# Patient Record
Sex: Male | Born: 1955 | Race: White | Hispanic: No | Marital: Married | Smoking: Former smoker
Health system: Southern US, Community
[De-identification: ages and names within clinical notes are randomized; demographics above are authoritative.]

## PROBLEM LIST (undated history)

## (undated) DIAGNOSIS — F1010 Alcohol abuse, uncomplicated: Secondary | ICD-10-CM

## (undated) DIAGNOSIS — I10 Essential (primary) hypertension: Secondary | ICD-10-CM

## (undated) DIAGNOSIS — E7800 Pure hypercholesterolemia, unspecified: Secondary | ICD-10-CM

## (undated) DIAGNOSIS — Z8739 Personal history of other diseases of the musculoskeletal system and connective tissue: Secondary | ICD-10-CM

## (undated) DIAGNOSIS — K279 Peptic ulcer, site unspecified, unspecified as acute or chronic, without hemorrhage or perforation: Secondary | ICD-10-CM

## (undated) DIAGNOSIS — I2510 Atherosclerotic heart disease of native coronary artery without angina pectoris: Secondary | ICD-10-CM

## (undated) DIAGNOSIS — K219 Gastro-esophageal reflux disease without esophagitis: Secondary | ICD-10-CM

## (undated) HISTORY — DX: Alcohol abuse, uncomplicated: F10.10

## (undated) HISTORY — DX: Gastro-esophageal reflux disease without esophagitis: K21.9

## (undated) HISTORY — DX: Atherosclerotic heart disease of native coronary artery without angina pectoris: I25.10

## (undated) HISTORY — DX: Peptic ulcer, site unspecified, unspecified as acute or chronic, without hemorrhage or perforation: K27.9

## (undated) HISTORY — PX: APPENDECTOMY: SHX54

## (undated) HISTORY — PX: NOSE SURGERY: SHX723

## (undated) HISTORY — DX: Pure hypercholesterolemia, unspecified: E78.00

## (undated) HISTORY — PX: HERNIA REPAIR: SHX51

## (undated) HISTORY — DX: Personal history of other diseases of the musculoskeletal system and connective tissue: Z87.39

## (undated) HISTORY — DX: Essential (primary) hypertension: I10

## (undated) HISTORY — PX: ACHILLES TENDON REPAIR: SUR1153

---

## 2010-10-01 DIAGNOSIS — G250 Essential tremor: Secondary | ICD-10-CM | POA: Insufficient documentation

## 2012-05-24 DIAGNOSIS — M5416 Radiculopathy, lumbar region: Secondary | ICD-10-CM | POA: Insufficient documentation

## 2012-05-24 DIAGNOSIS — M47816 Spondylosis without myelopathy or radiculopathy, lumbar region: Secondary | ICD-10-CM | POA: Insufficient documentation

## 2013-06-08 DIAGNOSIS — K5730 Diverticulosis of large intestine without perforation or abscess without bleeding: Secondary | ICD-10-CM | POA: Insufficient documentation

## 2013-12-07 DIAGNOSIS — M5416 Radiculopathy, lumbar region: Secondary | ICD-10-CM | POA: Insufficient documentation

## 2017-11-24 DIAGNOSIS — Z79899 Other long term (current) drug therapy: Secondary | ICD-10-CM | POA: Insufficient documentation

## 2018-01-19 ENCOUNTER — Ambulatory Visit: Payer: 59 | Admitting: Osteopathic Medicine

## 2018-01-19 ENCOUNTER — Telehealth: Payer: Self-pay | Admitting: Osteopathic Medicine

## 2018-01-19 ENCOUNTER — Encounter: Payer: Self-pay | Admitting: Osteopathic Medicine

## 2018-01-19 VITALS — BP 137/84 | HR 80 | Temp 97.9°F | Ht 72.0 in | Wt 210.6 lb

## 2018-01-19 DIAGNOSIS — K449 Diaphragmatic hernia without obstruction or gangrene: Secondary | ICD-10-CM | POA: Insufficient documentation

## 2018-01-19 DIAGNOSIS — K219 Gastro-esophageal reflux disease without esophagitis: Secondary | ICD-10-CM | POA: Insufficient documentation

## 2018-01-19 DIAGNOSIS — M5030 Other cervical disc degeneration, unspecified cervical region: Secondary | ICD-10-CM | POA: Insufficient documentation

## 2018-01-19 DIAGNOSIS — I10 Essential (primary) hypertension: Secondary | ICD-10-CM | POA: Diagnosis not present

## 2018-01-19 DIAGNOSIS — G5712 Meralgia paresthetica, left lower limb: Secondary | ICD-10-CM | POA: Insufficient documentation

## 2018-01-19 DIAGNOSIS — M545 Low back pain, unspecified: Secondary | ICD-10-CM | POA: Insufficient documentation

## 2018-01-19 DIAGNOSIS — E782 Mixed hyperlipidemia: Secondary | ICD-10-CM | POA: Insufficient documentation

## 2018-01-19 DIAGNOSIS — M5416 Radiculopathy, lumbar region: Secondary | ICD-10-CM

## 2018-01-19 DIAGNOSIS — M5412 Radiculopathy, cervical region: Secondary | ICD-10-CM | POA: Insufficient documentation

## 2018-01-19 DIAGNOSIS — F419 Anxiety disorder, unspecified: Secondary | ICD-10-CM | POA: Insufficient documentation

## 2018-01-19 DIAGNOSIS — G479 Sleep disorder, unspecified: Secondary | ICD-10-CM | POA: Insufficient documentation

## 2018-01-19 MED ORDER — OMEPRAZOLE MAGNESIUM 20 MG PO TBEC: 20.0000 mg | DELAYED_RELEASE_TABLET | Freq: Every day | ORAL | 3 refills | Status: AC

## 2018-01-19 MED ORDER — GABAPENTIN 600 MG PO TABS: ORAL_TABLET | ORAL | 3 refills | Status: DC

## 2018-01-19 MED ORDER — FLUVASTATIN SODIUM ER 80 MG PO TB24: 80.0000 mg | ORAL_TABLET | Freq: Every day | ORAL | 1 refills | Status: DC

## 2018-01-19 MED ORDER — LOSARTAN POTASSIUM 100 MG PO TABS: 100.0000 mg | ORAL_TABLET | Freq: Every day | ORAL | 3 refills | Status: DC

## 2018-01-19 NOTE — Progress Notes (Signed)
HPI: Steven Hebert is a 62 y.o. male who  has no past medical history on file.  [facility] today, 01/19/18,  for chief complaint of: New to establish care Discuss medications   Pleasant new patient here to establish care. Works as Geophysical data processorconstruction manager. Married.   Working on quitting alcohol, seeing a psychiatrist. Doing well on Acomprosate to promote EtOH abstinence, he is cutting back week by week with goal of being off alcohol and Xanax by early next year. Has started Melatonin.   GERD and Hx hiatal hernia and NSAID-induced stomach ulcer. Symptoms controlled on PPI.   Cronic L leg pain, following with orthopedics for this and lower back pain and neck pain. Has had several epidural injections which provided relief. Takes gabapentin qhs and rare use hydrocodone-APAP.   HTN controlled on Losartan.   HLD would like to get back on fluvastatin.    Available records reviewed:   Patient had annual physical 11/24/2017 w/ Dr Kathryne GinGavour [facility]  No medication changes required at that time.  Patient also being monitored for essential hypertension which is under control, chronic pain issues including meralgia paresthetica of left side, left lumbar radiculopathy.  Chronic benzodiazepine use was also addressed.  Flu vaccine was received.  Alcohol use was discussed referral was made to pain management.  Prior to that, had seen a few other doctors at that practice over the years.   PDMP reviewed:   Chronic use alprazolam 0.5 mg daily (#30 x30 days, filled monthly), Rx Dr Ulis Riasavid Cloward   Occasional Rx for Hydrocodone    Past medical, surgical, social and family history reviewed:  Patient Active Problem List   Diagnosis Date Noted  . Anxiety 01/19/2018  . Cervical radiculitis 01/19/2018  . DDD (degenerative disc disease), cervical 01/19/2018  . Esophageal reflux 01/19/2018  . Essential hypertension 01/19/2018  . Hiatal hernia 01/19/2018  .  Low back pain 01/19/2018  . Meralgia paresthetica of left side 01/19/2018  . Mixed hyperlipidemia 01/19/2018  . Sleep disorder 01/19/2018  . Chronic prescription benzodiazepine use 11/24/2017  . Diverticulosis of colon 06/08/2013  . Left lumbar radiculopathy 05/24/2012  . Essential tremor 10/01/2010    Past Surgical History:  Procedure Laterality Date  . ACHILLES TENDON REPAIR    . APPENDECTOMY    . HERNIA REPAIR    . NOSE SURGERY      Social History   Tobacco Use  . Smoking status: Former Smoker    Packs/day: 0.50    Years: 10.00    Pack years: 5.00    Types: Cigarettes    Last attempt to quit: 1977    Years since quitting: 42.9  . Smokeless tobacco: Never Used  Substance Use Topics  . Alcohol use: Yes    Alcohol/week: 28.0 standard drinks    Types: 28 Cans of beer per week    No family history on file.   Current medication list and allergy/intolerance information reviewed:    Current Outpatient Medications  Medication Sig Dispense Refill  . ALPRAZolam (XANAX) 0.5 MG tablet Take by mouth.    . gabapentin (NEURONTIN) 600 MG tablet TAKE 1 TABLET BY MOUTH EVERY DAY AT NIGHT 90 tablet 3  . HYDROcodone-acetaminophen (NORCO/VICODIN) 5-325 MG tablet Take by mouth.    . losartan (COZAAR) 100 MG tablet Take 1 tablet (100 mg total) by mouth daily. 90 tablet 3  . omeprazole (PRILOSEC OTC) 20 MG tablet Take 1 tablet (20 mg total) by mouth daily. 90  tablet 3  . fluvastatin XL (LESCOL XL) 80 MG 24 hr tablet Take 1 tablet (80 mg total) by mouth daily. 90 tablet 1   No current facility-administered medications for this visit.     Allergies  Allergen Reactions  . Nsaids Other (See Comments)  . Penicillins Rash      Review of Systems:  Constitutional:  No  fever, no chills, No recent illness, No unintentional weight changes. No significant fatigue.   HEENT: No  headache, no vision change, no hearing change, No sore throat, No  sinus pressure  Cardiac: No  chest pain, No   pressure, No palpitations, No  Orthopnea  Respiratory:  No  shortness of breath. No  Cough  Gastrointestinal: No  abdominal pain, No  nausea, No  vomiting,  No  blood in stool, No  diarrhea, No  Constipation, +heartburn  Musculoskeletal: No new myalgia/arthralgia  Skin: No  Rash, No other wounds/concerning lesions  Genitourinary: No  incontinence, No  abnormal genital bleeding, No abnormal genital discharge  Hem/Onc: No  easy bruising/bleeding, No  abnormal lymph node  Endocrine: No cold intolerance,  No heat intolerance. No polyuria/polydipsia/polyphagia   Neurologic: No  weakness, No  dizziness, No  slurred speech/focal weakness/facial droop, +numness/tingling L leg   Psychiatric: No  concerns with depression, No  concerns with anxiety, +sleep problems, No mood problems  Exam:  BP 137/84 (BP Location: Left Arm, Patient Position: Sitting, Cuff Size: Normal)   Pulse 80   Temp 97.9 F (36.6 C) (Oral)   Ht 6' (1.829 m)   Wt 210 lb 9.6 oz (95.5 kg)   BMI 28.56 kg/m   Constitutional: VS see above. General Appearance: alert, well-developed, well-nourished, NAD  Eyes: Normal lids and conjunctive, non-icteric sclera  Ears, Nose, Mouth, Throat: MMM, Normal external inspection ears/nares/mouth/lips/gums. TM normal bilaterally. Pharynx/tonsils no erythema, no exudate. Nasal mucosa normal.   Neck: No masses, trachea midline. No thyroid enlargement. No tenderness/mass appreciated. No lymphadenopathy  Respiratory: Normal respiratory effort. no wheeze, no rhonchi, no rales  Cardiovascular: S1/S2 normal, no murmur, no rub/gallop auscultated. RRR. No lower extremity edema.   Gastrointestinal: Nontender, no masses. No hepatomegaly, no splenomegaly. No hernia appreciated. Bowel sounds normal. Rectal exam deferred.   Musculoskeletal: Gait normal. No clubbing/cyanosis of digits.   Neurological: Normal balance/coordination. No tremor. No cranial nerve deficit on limited exam. Motor and  sensation intact and symmetric. Cerebellar reflexes intact.   Skin: warm, dry, intact. No rash/ulcer. No concerning nevi or subq nodules on limited exam.    Psychiatric: Normal judgment/insight. Normal mood and affect. Oriented x3.      ASSESSMENT/PLAN: The primary encounter diagnosis was Hiatal hernia. Diagnoses of Gastroesophageal reflux disease, esophagitis presence not specified, Essential hypertension, Mixed hyperlipidemia, and Left lumbar radiculopathy were also pertinent to this visit.   Orders Placed This Encounter  Procedures  . CBC  . COMPLETE METABOLIC PANEL WITH GFR  . Lipid panel    Meds ordered this encounter  Medications  . fluvastatin XL (LESCOL XL) 80 MG 24 hr tablet    Sig: Take 1 tablet (80 mg total) by mouth daily.    Dispense:  90 tablet    Refill:  1  . losartan (COZAAR) 100 MG tablet    Sig: Take 1 tablet (100 mg total) by mouth daily.    Dispense:  90 tablet    Refill:  3  . omeprazole (PRILOSEC OTC) 20 MG tablet    Sig: Take 1 tablet (20 mg total) by mouth  daily.    Dispense:  90 tablet    Refill:  3  . gabapentin (NEURONTIN) 600 MG tablet    Sig: TAKE 1 TABLET BY MOUTH EVERY DAY AT NIGHT    Dispense:  90 tablet    Refill:  3    Patient Instructions  Will request records from:  [facility]  [facility]   When in need of refills, please have your pharmacy send requests to [place]. If any problems, please call our office!   Will plan to see each other again in 3 months to check in, and repeat blood work prior to that visit. Orders are in.   Happy Holidays!          Visit summary with medication list and pertinent instructions was printed for patient to review. All questions at time of visit were answered - patient instructed to contact office with any additional concerns or updates. ER/RTC precautions were reviewed with the patient.    Please note: voice recognition software was used to produce this  document, and typos may escape review. Please contact Dr. Lyn Hollingshead for any needed clarifications.     Follow-up plan: Return in about 3 months (around 04/20/2018) for recheck how you're doing with alcohol, alprazolam, etc! Sooner if needed .

## 2018-01-19 NOTE — Telephone Encounter (Signed)
Received fax from [employer] that [employer] requires a PA. Information has been sent to the insurance company. Awaiting determination.

## 2018-01-19 NOTE — Patient Instructions (Signed)
Will request records from:  [facility]  [facility]   [facility]   When in need of refills, please have your pharmacy send requests to us. If any problems, please call our office!   Will plan to see each other again in 3 months to check in, and repeat blood work prior to that visit. Orders are in.   Happy Holidays!

## 2018-01-20 ENCOUNTER — Encounter: Payer: Self-pay | Admitting: Osteopathic Medicine

## 2018-01-20 NOTE — Telephone Encounter (Signed)
Called [facility] and advised them of the approval from 01/19/2018 through 01/21/2019. Pharmacy aware and form sent to scan.

## 2018-04-19 LAB — COMPLETE METABOLIC PANEL WITH GFR
AG Ratio: 1.6 (calc) (ref 1.0–2.5)
ALT: 43 U/L (ref 9–46)
AST: 27 U/L (ref 10–35)
Albumin: 4.4 g/dL (ref 3.6–5.1)
Alkaline phosphatase (APISO): 62 U/L (ref 35–144)
BILIRUBIN TOTAL: 0.7 mg/dL (ref 0.2–1.2)
BUN: 16 mg/dL (ref 7–25)
CO2: 26 mmol/L (ref 20–32)
CREATININE: 0.89 mg/dL (ref 0.70–1.25)
Calcium: 9.7 mg/dL (ref 8.6–10.3)
Chloride: 102 mmol/L (ref 98–110)
GFR, EST AFRICAN AMERICAN: 105 mL/min/{1.73_m2} (ref >=60)
GFR, EST NON AFRICAN AMERICAN: 90 mL/min/{1.73_m2} (ref >=60)
Globulin: 2.8 g/dL (calc) (ref 1.9–3.7)
Glucose, Bld: 111 mg/dL — ABNORMAL HIGH (ref 65–99)
Potassium: 4.4 mmol/L (ref 3.5–5.3)
Sodium: 137 mmol/L (ref 135–146)
Total Protein: 7.2 g/dL (ref 6.1–8.1)

## 2018-04-19 LAB — CBC
HCT: 46.1 % (ref 38.5–50.0)
Hemoglobin: 16 g/dL (ref 13.2–17.1)
MCH: 32.2 pg (ref 27.0–33.0)
MCHC: 34.7 g/dL (ref 32.0–36.0)
MCV: 92.8 fL (ref 80.0–100.0)
MPV: 10.6 fL (ref 7.5–12.5)
Platelets: 193 10*3/uL (ref 140–400)
RBC: 4.97 10*6/uL (ref 4.20–5.80)
RDW: 13.2 % (ref 11.0–15.0)
WBC: 3.6 10*3/uL — ABNORMAL LOW (ref 3.8–10.8)

## 2018-04-19 LAB — LIPID PANEL
Cholesterol: 208 mg/dL — ABNORMAL HIGH (ref <200)
HDL: 50 mg/dL (ref >=40)
LDL Cholesterol (Calc): 130 mg/dL (calc) — ABNORMAL HIGH
Non-HDL Cholesterol (Calc): 158 mg/dL (calc) — ABNORMAL HIGH (ref <130)
Total CHOL/HDL Ratio: 4.2 (calc) (ref <5.0)
Triglycerides: 160 mg/dL — ABNORMAL HIGH (ref <150)

## 2018-04-19 LAB — TEST AUTHORIZATION

## 2018-04-19 LAB — HEMOGLOBIN A1C W/OUT EAG: Hgb A1c MFr Bld: 5.6 % of total Hgb (ref <5.7)

## 2018-04-20 ENCOUNTER — Other Ambulatory Visit: Payer: Self-pay

## 2018-04-20 ENCOUNTER — Ambulatory Visit: Payer: 59 | Admitting: Osteopathic Medicine

## 2018-04-20 ENCOUNTER — Encounter: Payer: Self-pay | Admitting: Osteopathic Medicine

## 2018-04-20 VITALS — BP 136/76 | HR 74 | Temp 97.7°F | Wt 215.7 lb

## 2018-04-20 DIAGNOSIS — I10 Essential (primary) hypertension: Secondary | ICD-10-CM | POA: Diagnosis not present

## 2018-04-20 DIAGNOSIS — M25512 Pain in left shoulder: Secondary | ICD-10-CM

## 2018-04-20 DIAGNOSIS — E782 Mixed hyperlipidemia: Secondary | ICD-10-CM | POA: Diagnosis not present

## 2018-04-20 DIAGNOSIS — M5416 Radiculopathy, lumbar region: Secondary | ICD-10-CM | POA: Diagnosis not present

## 2018-04-20 MED ORDER — PRAVASTATIN SODIUM 80 MG PO TABS: 80.0000 mg | ORAL_TABLET | Freq: Every day | ORAL | 3 refills | Status: DC

## 2018-04-20 MED ORDER — DICLOFENAC SODIUM 1 % TD GEL: 4.0000 g | Freq: Four times a day (QID) | TRANSDERMAL | 11 refills | Status: DC

## 2018-04-20 MED ORDER — TRAZODONE HCL 50 MG PO TABS: 25.0000 mg | ORAL_TABLET | Freq: Every evening | ORAL | 0 refills | Status: DC | PRN

## 2018-04-20 NOTE — Patient Instructions (Signed)
Plan:  Voltaren gel for shoulder.  Can also continue Tylenol up to 1000 mg 4 times daily.  Would recommend follow-up with Dr. Denyse Amass or Dr. Karie Schwalbe, our sports medicine specialist here in the office, if needed.  See printed instructions.  Can switch fluvastatin to pravastatin.  Can supplement with co-Q10 if you experience muscle aches.  Or we can reduce dose/take every other day.  Let me know if it causes you any problems.  As were coming off of the alprazolam, let's try trazodone as needed for sleep.  Can play with the dose a little anywhere from half a tablet to 2 tablets.  Let's plan to recheck numbers again in another 3 months, we did today can come in early for labs and then we can go over the results.

## 2018-04-20 NOTE — Progress Notes (Signed)
HPI: Steven Hebert is a 63 y.o. male who  has a past medical history of Alcohol abuse, H/O degenerative disc disease, High blood pressure, and High cholesterol.  he presents to [facility] today, 04/20/18,  for chief complaint of:  Follow up: cholesterol, medications, quitting EtOH, anxiety/sleep,  new problem: Shoulder pain  Here to recheck from last visit - just keeping tabs on his progress w/ quitting EtOH and tapering off alprazolam rechecking labs. Labs were printed and reviewed in detail w/ patient.   PDMP reviewed: last Xanax filled 03/03/2018. Concerned about trouble sleeping when he eventually comes off this altogether.   Cholesterol medication is a bit expensive for him.  Had been on other medications in the past, he thinks may be atorvastatin and a few others, that caused some issues with muscle aches.  He is also having some right shoulder pain recently after working out more intensely.  Has been trying to rest it.    At today's visit 04/20/18 ... PMH, PSH, FH reviewed and updated as needed.  Current medication list and allergy/intolerance hx reviewed and updated as needed. (See remainder of HPI, ROS, Phys Exam below)   No results found.  No results found for this or any previous visit (from the past 72 hour(s)).        ASSESSMENT/PLAN: The primary encounter diagnosis was Essential hypertension. Diagnoses of Mixed hyperlipidemia, Left lumbar radiculopathy, and Acute pain of left shoulder were also pertinent to this visit.   Orders Placed This Encounter  Procedures  . Lipid Panel With LDL/HDL Ratio  . Hepatic function panel  . Lipid Panel w/reflex Direct LDL     Meds ordered this encounter  Medications  . traZODone (DESYREL) 50 MG tablet    Sig: Take 0.5-2 tablets (25-100 mg total) by mouth at bedtime as needed for sleep.    Dispense:  90 tablet    Refill:  0  . pravastatin (PRAVACHOL) 80 MG tablet    Sig: Take 1 tablet  (80 mg total) by mouth daily.    Dispense:  90 tablet    Refill:  3  . diclofenac sodium (VOLTAREN) 1 % GEL    Sig: Apply 4 g topically 4 (four) times daily. To affected joint.    Dispense:  100 g    Refill:  11    Please run with GoodRx coupon    The 10-year ASCVD risk score Denman George DC Jr., et al., 2013) is: 15.6%   Values used to calculate the score:     Age: 71 years     Sex: Male     Is Non-Hispanic African American: No     Diabetic: No     Tobacco smoker: No     Systolic Blood Pressure: 136 mmHg     Is BP treated: Yes     HDL Cholesterol: 50 mg/dL     Total Cholesterol: 208 mg/dL   Patient Instructions  Plan:  Voltaren gel for shoulder.  Can also continue Tylenol up to 1000 mg 4 times daily.  Would recommend follow-up with Dr. Denyse Amass or Dr. Karie Schwalbe, our sports medicine specialist here in the office, if needed.  See printed instructions.  Can switch fluvastatin to pravastatin.  Can supplement with co-Q10 if you experience muscle aches.  Or we can reduce dose/take every other day.  Let me know if it causes you any problems.  As were coming off of the alprazolam, let's try trazodone as needed for sleep.  Can play with the dose a little anywhere from half a tablet to 2 tablets.  Let's plan to recheck numbers again in another 3 months, we did today can come in early for labs and then we can go over the results.       Follow-up plan: Return in about 3 months (around 07/21/2018) for Recheck cholesterol, sleep, prior to visit.  Sports medicine sooner if needed for shoulder. .                                                 ################################################# ################################################# ################################################# #################################################    Current Meds  Medication Sig  . acamprosate (CAMPRAL) 333 MG tablet Take 666 mg by mouth 2 (two) times daily.  Marland Kitchen  ALPRAZolam (XANAX) 0.5 MG tablet Take by mouth.  . fluvastatin XL (LESCOL XL) 80 MG 24 hr tablet Take 1 tablet (80 mg total) by mouth daily.  Marland Kitchen gabapentin (NEURONTIN) 600 MG tablet TAKE 1 TABLET BY MOUTH EVERY DAY AT NIGHT  . losartan (COZAAR) 100 MG tablet Take 1 tablet (100 mg total) by mouth daily.  Marland Kitchen omeprazole (PRILOSEC OTC) 20 MG tablet Take 1 tablet (20 mg total) by mouth daily.    Allergies  Allergen Reactions  . Nsaids Other (See Comments)  . Penicillins Rash       Review of Systems:  Constitutional: No recent illness  HEENT: No  headache, no vision change  Cardiac: No  chest pain, No  pressure, No palpitations  Respiratory:  No  shortness of breath. No  Cough  Gastrointestinal: No  abdominal pain  Musculoskeletal: +new myalgia/arthralgia  Skin: No  Rash  Neurologic: No  weakness, No  Dizziness  Psychiatric: No  concerns with depression, +concerns with anxiety  Exam:  BP 136/76 (BP Location: Left Arm, Patient Position: Sitting, Cuff Size: Normal)   Pulse 74   Temp 97.7 F (36.5 C) (Oral)   Wt 215 lb 11.2 oz (97.8 kg)   BMI 29.25 kg/m   Constitutional: VS see above. General Appearance: alert, well-developed, well-nourished, NAD  Eyes: Normal lids and conjunctive, non-icteric sclera  Ears, Nose, Mouth, Throat: MMM, Normal external inspection ears/nares/mouth/lips/gums.  Neck: No masses, trachea midline.   Respiratory: Normal respiratory effort. no wheeze, no rhonchi, no rales  Cardiovascular: S1/S2 normal, no murmur, no rub/gallop auscultated. RRR.   Musculoskeletal: Gait normal. Symmetric and independent movement of all extremities  Neurological: Normal balance/coordination. No tremor.  Skin: warm, dry, intact.   Psychiatric: Normal judgment/insight. Normal mood and affect. Oriented x3.       Visit summary with medication list and pertinent instructions was printed for patient to review, patient was advised to alert [place] if any updates are  needed. All questions at time of visit were answered - patient instructed to contact office with any additional concerns. ER/RTC precautions were reviewed with the patient and understanding verbalized.     Please note: voice recognition software was used to produce this document, and typos may escape review. Please contact Dr. Lyn Hollingshead for any needed clarifications.    Follow up plan: Return in about 3 months (around 07/21/2018) for Recheck cholesterol, sleep, prior to visit.  Sports medicine sooner if needed for shoulder. Marland Kitchen

## 2018-05-15 ENCOUNTER — Other Ambulatory Visit: Payer: Self-pay | Admitting: Osteopathic Medicine

## 2018-05-15 NOTE — Telephone Encounter (Signed)
Please advise 

## 2018-05-17 ENCOUNTER — Other Ambulatory Visit: Payer: Self-pay | Admitting: Osteopathic Medicine

## 2018-06-05 ENCOUNTER — Other Ambulatory Visit: Payer: Self-pay | Admitting: Osteopathic Medicine

## 2018-06-05 DIAGNOSIS — M5416 Radiculopathy, lumbar region: Secondary | ICD-10-CM

## 2018-06-27 ENCOUNTER — Other Ambulatory Visit: Payer: Self-pay | Admitting: Osteopathic Medicine

## 2018-06-27 DIAGNOSIS — I10 Essential (primary) hypertension: Secondary | ICD-10-CM

## 2018-07-27 ENCOUNTER — Other Ambulatory Visit: Payer: Self-pay | Admitting: Osteopathic Medicine

## 2018-07-27 ENCOUNTER — Ambulatory Visit: Payer: 59 | Admitting: Osteopathic Medicine

## 2018-07-28 ENCOUNTER — Encounter: Payer: Self-pay | Admitting: Osteopathic Medicine

## 2018-07-28 ENCOUNTER — Ambulatory Visit (INDEPENDENT_AMBULATORY_CARE_PROVIDER_SITE_OTHER): Payer: 59 | Admitting: Osteopathic Medicine

## 2018-07-28 DIAGNOSIS — G479 Sleep disorder, unspecified: Secondary | ICD-10-CM | POA: Diagnosis not present

## 2018-07-28 DIAGNOSIS — Z0000 Encounter for general adult medical examination without abnormal findings: Secondary | ICD-10-CM | POA: Diagnosis not present

## 2018-07-28 DIAGNOSIS — I10 Essential (primary) hypertension: Secondary | ICD-10-CM | POA: Diagnosis not present

## 2018-07-28 DIAGNOSIS — E782 Mixed hyperlipidemia: Secondary | ICD-10-CM

## 2018-07-28 LAB — HEPATIC FUNCTION PANEL
AG Ratio: 1.6 (calc) (ref 1.0–2.5)
ALKALINE PHOSPHATASE (APISO): 63 U/L (ref 35–144)
ALT: 45 U/L (ref 9–46)
AST: 27 U/L (ref 10–35)
Albumin: 4.4 g/dL (ref 3.6–5.1)
Bilirubin, Direct: 0.1 mg/dL (ref 0.0–0.2)
Globulin: 2.8 g/dL (calc) (ref 1.9–3.7)
Indirect Bilirubin: 0.4 mg/dL (calc) (ref 0.2–1.2)
Total Bilirubin: 0.5 mg/dL (ref 0.2–1.2)
Total Protein: 7.2 g/dL (ref 6.1–8.1)

## 2018-07-28 LAB — LIPID PANEL W/REFLEX DIRECT LDL
Cholesterol: 196 mg/dL (ref <200)
HDL: 53 mg/dL (ref >=40)
LDL Cholesterol (Calc): 115 mg/dL (calc) — ABNORMAL HIGH
Non-HDL Cholesterol (Calc): 143 mg/dL (calc) — ABNORMAL HIGH (ref <130)
Total CHOL/HDL Ratio: 3.7 (calc) (ref <5.0)
Triglycerides: 160 mg/dL — ABNORMAL HIGH (ref <150)

## 2018-07-28 MED ORDER — ALPRAZOLAM 0.5 MG PO TABS: 0.2500 mg | ORAL_TABLET | Freq: Two times a day (BID) | ORAL | 0 refills | Status: DC | PRN

## 2018-07-28 MED ORDER — TRAZODONE HCL 100 MG PO TABS: 100.0000 mg | ORAL_TABLET | Freq: Every day | ORAL | 3 refills | Status: DC

## 2018-07-28 NOTE — Progress Notes (Signed)
Virtual Visit via Video (App used: Doximity) Note  I connected with      Steven Hebert on 07/28/18 at 8:10 by a telemedicine application and verified that I am speaking with the correct person using two identifiers.  Patient is at home I am in office    I discussed the limitations of evaluation and management by telemedicine and the availability of in person appointments. The patient expressed understanding and agreed to proceed.  History of Present Illness: Steven Hebert is a 63 y.o. male who would like to discuss labs/cholesterol, sleep   Cholesterol: Few mos ago we switched Fluvastatin to Pravastatin d/t cost.  LDL went from 130 down to 604115 TG same at 160 HDL slight improvement from 50 to 53 Hepatic fxn ok  Insomnia:  Started Trazodone for sleep as he was coming off Xanax.  Currently taking 100 mg qhs and doing well w/ this     Observations/Objective: There were no vitals taken for this visit. BP Readings from Last 3 Encounters:  04/20/18 136/76  01/19/18 137/84   Exam: Normal Speech.  NAD    Lab and Radiology Results Results for orders placed or performed in visit on 04/20/18 (from the past 72 hour(s))  Hepatic function panel     Status: None   Collection Time: 07/27/18  8:37 AM  Result Value Ref Range   Total Protein 7.2 6.1 - 8.1 g/dL   Albumin 4.4 3.6 - 5.1 g/dL   Globulin 2.8 1.9 - 3.7 g/dL (calc)   AG Ratio 1.6 1.0 - 2.5 (calc)   Total Bilirubin 0.5 0.2 - 1.2 mg/dL   Bilirubin, Direct 0.1 0.0 - 0.2 mg/dL   Indirect Bilirubin 0.4 0.2 - 1.2 mg/dL (calc)   Alkaline phosphatase (APISO) 63 35 - 144 U/L   AST 27 10 - 35 U/L   ALT 45 9 - 46 U/L  Lipid Panel w/reflex Direct LDL     Status: Abnormal   Collection Time: 07/27/18  8:41 AM  Result Value Ref Range   Cholesterol 196 <200 mg/dL   HDL 53 > OR = 40 mg/dL   Triglycerides 540160 (H) <150 mg/dL   LDL Cholesterol (Calc) 115 (H) mg/dL (calc)    Comment: Reference range: <100 . Desirable range <100  mg/dL for primary prevention;   <70 mg/dL for patients with CHD or diabetic patients  with > or = 2 CHD risk factors. Marland Kitchen. LDL-C is now calculated using the Steven-Hopkins  calculation, which is a validated novel method providing  better accuracy than the Friedewald equation in the  estimation of LDL-C.  Steven PollenMartin Hebert et al. Lenox AhrJAMA. 9811;914(782013;310(19): 2061-2068  (http://education.[employer].com/faq/FAQ164)    Total CHOL/HDL Ratio 3.7 <5.0 (calc)   Non-HDL Cholesterol (Calc) 143 (H) <130 mg/dL (calc)    Comment: For patients with diabetes plus 1 major ASCVD risk  factor, treating to a non-HDL-C goal of <100 mg/dL  (LDL-C of <29<70 mg/dL) is considered a therapeutic  option.    No results found.     Assessment and Plan: 63 y.o. male with The primary encounter diagnosis was Sleep disorder. Diagnoses of Essential hypertension and Mixed hyperlipidemia were also pertinent to this visit.    The 10-year ASCVD risk score Denman George(Goff DC Jr., et al., 2013) is: 15.6%   Values used to calculate the score:     Age: [age] years     Sex: Male     Is Non-Hispanic African American: No     Diabetic: No  Tobacco smoker: No     Systolic Blood Pressure: 938 mmHg     Is BP treated: Yes     HDL Cholesterol: 53 mg/dL     Total Cholesterol: 196 mg/dL  PDMP not reviewed this encounter. No orders of the defined types were placed in this encounter.  Meds ordered this encounter  Medications  . traZODone (DESYREL) 100 MG tablet    Sig: Take 1 tablet (100 mg total) by mouth at bedtime.    Dispense:  90 tablet    Refill:  3    Cancel 50 mg tablets, thanks  . ALPRAZolam (XANAX) 0.5 MG tablet    Sig: Take 0.5-1 tablets (0.25-0.5 mg total) by mouth 2 (two) times daily as needed for anxiety or sleep.    Dispense:  30 tablet    Refill:  0    Labs ordered for future visit. Annual physical / preventive care was NOT performed or billed today.    Follow Up Instructions: Return in about 4 months (around 11/27/2018)  for [place] (labs prior to visit, orders are in).    I discussed the assessment and treatment plan with the patient. The patient was provided an opportunity to ask questions and all were answered. The patient agreed with the plan and demonstrated an understanding of the instructions.   The patient was advised to call back or seek an in-person evaluation if any new concerns, if symptoms worsen or if the condition fails to improve as anticipated.  15 minutes of non-face-to-face time was provided during this encounter.                      Historical information moved to improve visibility of documentation.  Past Medical History:  Diagnosis Date  . Alcohol abuse   . H/O degenerative disc disease   . High blood pressure   . High cholesterol    Past Surgical History:  Procedure Laterality Date  . ACHILLES TENDON REPAIR    . APPENDECTOMY    . HERNIA REPAIR    . NOSE SURGERY     Social History   Tobacco Use  . Smoking status: Former Smoker    Packs/day: 0.50    Years: 10.00    Pack years: 5.00    Types: Cigarettes    Quit date: 1977    Years since quitting: 43.5  . Smokeless tobacco: Never Used  Substance Use Topics  . Alcohol use: Yes    Alcohol/week: 28.0 standard drinks    Types: 28 Cans of beer per week   family history is not on file.  Medications: Current Outpatient Medications  Medication Sig Dispense Refill  . acamprosate (CAMPRAL) 333 MG tablet TAKE 2 TABLETS BY MOUTH TWICE A DAY 120 tablet 2  . ALPRAZolam (XANAX) 0.5 MG tablet Take by mouth.    . diclofenac sodium (VOLTAREN) 1 % GEL Apply 4 g topically 4 (four) times daily. To affected joint. 100 g 11  . fluvastatin XL (LESCOL XL) 80 MG 24 hr tablet Take 1 tablet (80 mg total) by mouth daily. 90 tablet 1  . gabapentin (NEURONTIN) 600 MG tablet TAKE 1 TABLET BY MOUTH EVERY DAY AT NIGHT 90 tablet 1  . losartan (COZAAR) 100 MG tablet TAKE 1 TABLET BY MOUTH EVERY DAY 90 tablet 0  . omeprazole  (PRILOSEC OTC) 20 MG tablet Take 1 tablet (20 mg total) by mouth daily. 90 tablet 3  . pravastatin (PRAVACHOL) 80 MG tablet Take 1 tablet (80 mg  total) by mouth daily. 90 tablet 3  . traZODone (DESYREL) 50 MG tablet TAKE 0.5-2 TABLETS (25-100 MG TOTAL) BY MOUTH AT BEDTIME AS NEEDED FOR SLEEP. 60 tablet 1   No current facility-administered medications for this visit.    Allergies  Allergen Reactions  . Nsaids Other (See Comments)  . Penicillins Rash    PDMP not reviewed this encounter. No orders of the defined types were placed in this encounter.  No orders of the defined types were placed in this encounter.

## 2018-09-22 ENCOUNTER — Other Ambulatory Visit: Payer: Self-pay | Admitting: Osteopathic Medicine

## 2018-09-22 DIAGNOSIS — I10 Essential (primary) hypertension: Secondary | ICD-10-CM

## 2018-09-22 NOTE — Telephone Encounter (Signed)
Forwarding medication refill request to PCP for review. 

## 2018-11-29 ENCOUNTER — Ambulatory Visit (INDEPENDENT_AMBULATORY_CARE_PROVIDER_SITE_OTHER): Payer: 59 | Admitting: Family Medicine

## 2018-11-29 ENCOUNTER — Encounter: Payer: Self-pay | Admitting: Family Medicine

## 2018-11-29 ENCOUNTER — Other Ambulatory Visit: Payer: Self-pay

## 2018-11-29 VITALS — BP 140/88 | Temp 98.1°F | Ht 72.0 in | Wt 221.4 lb

## 2018-11-29 DIAGNOSIS — Z0000 Encounter for general adult medical examination without abnormal findings: Secondary | ICD-10-CM | POA: Diagnosis not present

## 2018-11-29 DIAGNOSIS — Z6830 Body mass index (BMI) 30.0-30.9, adult: Secondary | ICD-10-CM | POA: Diagnosis not present

## 2018-11-29 DIAGNOSIS — Z23 Encounter for immunization: Secondary | ICD-10-CM

## 2018-11-29 LAB — COMPLETE METABOLIC PANEL WITH GFR
AG Ratio: 1.6 (calc) (ref 1.0–2.5)
ALT: 43 U/L (ref 9–46)
AST: 28 U/L (ref 10–35)
Albumin: 4.5 g/dL (ref 3.6–5.1)
Alkaline phosphatase (APISO): 64 U/L (ref 35–144)
BUN: 15 mg/dL (ref 7–25)
CO2: 24 mmol/L (ref 20–32)
Calcium: 9.7 mg/dL (ref 8.6–10.3)
Chloride: 103 mmol/L (ref 98–110)
Creat: 0.88 mg/dL (ref 0.70–1.25)
GFR, Est African American: 104 mL/min/{1.73_m2} (ref >=60)
GFR, Est Non African American: 90 mL/min/{1.73_m2} (ref >=60)
Globulin: 2.9 g/dL (calc) (ref 1.9–3.7)
Glucose, Bld: 118 mg/dL (ref 65–139)
Potassium: 4.1 mmol/L (ref 3.5–5.3)
Sodium: 137 mmol/L (ref 135–146)
Total Bilirubin: 0.6 mg/dL (ref 0.2–1.2)
Total Protein: 7.4 g/dL (ref 6.1–8.1)

## 2018-11-29 LAB — CBC
HCT: 47.2 % (ref 38.5–50.0)
Hemoglobin: 15.9 g/dL (ref 13.2–17.1)
MCH: 31.1 pg (ref 27.0–33.0)
MCHC: 33.7 g/dL (ref 32.0–36.0)
MCV: 92.4 fL (ref 80.0–100.0)
MPV: 10.3 fL (ref 7.5–12.5)
Platelets: 208 10*3/uL (ref 140–400)
RBC: 5.11 10*6/uL (ref 4.20–5.80)
RDW: 13.2 % (ref 11.0–15.0)
WBC: 4.6 10*3/uL (ref 3.8–10.8)

## 2018-11-29 LAB — LIPID PANEL
Cholesterol: 201 mg/dL — ABNORMAL HIGH (ref <200)
HDL: 52 mg/dL (ref >=40)
LDL Cholesterol (Calc): 117 mg/dL (calc) — ABNORMAL HIGH
Non-HDL Cholesterol (Calc): 149 mg/dL (calc) — ABNORMAL HIGH (ref <130)
Total CHOL/HDL Ratio: 3.9 (calc) (ref <5.0)
Triglycerides: 200 mg/dL — ABNORMAL HIGH (ref <150)

## 2018-11-29 LAB — PSA, TOTAL WITH REFLEX TO PSA, FREE: PSA, Total: 1 ng/mL (ref <=4.0)

## 2018-11-29 LAB — HEMOGLOBIN A1C W/OUT EAG: Hgb A1c MFr Bld: 5.5 % of total Hgb (ref <5.7)

## 2018-11-29 MED ORDER — HYDROCODONE-ACETAMINOPHEN 5-325 MG PO TABS: 1.0000 | ORAL_TABLET | Freq: Four times a day (QID) | ORAL | 0 refills | Status: DC | PRN

## 2018-11-29 MED ORDER — PRAVASTATIN SODIUM 80 MG PO TABS: 80.0000 mg | ORAL_TABLET | Freq: Every day | ORAL | 3 refills | Status: DC

## 2018-11-29 NOTE — Patient Instructions (Addendum)
Thank you for coming in today.  Continue current medicine.   Recheck in 6-12 months or sooner if needed.   Return sooner if needed.

## 2018-11-29 NOTE — Progress Notes (Signed)
Steven Hebert is a 63 y.o. male who presents to [facility]: Primary Care Sports Medicine today for well adult visit.   Steven Hebert is doing reasonably well.  He has several medical problems that are currently reasonably well managed.  He notes that he has various aches and pains that are typically controlled.  He is currently in the process of receiving epidural steroid injections for back and leg pain.  He had an episode of trapezius and rhomboid pain a month ago that is resolving now.  He will very occasionally use hydrocodone.  This was last prescribed September 2019.  He would like a small supply refill if able to help manage exacerbations of these various pain complaints.  Additionally he had colonoscopy in 2014 at [facility] as part of an inpatient work-up for GI bleeding.  Colonoscopy was reportedly normal.  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  He gets some exercise part of work.  He is try to eat a careful diet. ROS as above:  Past Medical History:  Diagnosis Date  . Alcohol abuse   . H/O degenerative disc disease   . High blood pressure   . High cholesterol    Past Surgical History:  Procedure Laterality Date  . ACHILLES TENDON REPAIR    . APPENDECTOMY    . HERNIA REPAIR    . NOSE SURGERY     Social History   Tobacco Use  . Smoking status: Former Smoker    Packs/day: 0.50    Years: 10.00    Pack years: 5.00    Types: Cigarettes    Quit date: 1977    Years since quitting: 43.8  . Smokeless tobacco: Never Used  Substance Use Topics  . Alcohol use: Yes    Alcohol/week: 28.0 standard drinks    Types: 28 Cans of beer per week   family history is not on file.  Medications: Current Outpatient Medications  Medication Sig Dispense Refill  . acamprosate (CAMPRAL) 333 MG tablet TAKE 2 TABLETS BY MOUTH TWICE A DAY 120 tablet 2  . ALPRAZolam (XANAX) 0.5 MG  tablet Take 0.5-1 tablets (0.25-0.5 mg total) by mouth 2 (two) times daily as needed for anxiety or sleep. 30 tablet 0  . diclofenac sodium (VOLTAREN) 1 % GEL Apply 4 g topically 4 (four) times daily. To affected joint. 100 g 11  . gabapentin (NEURONTIN) 600 MG tablet TAKE 1 TABLET BY MOUTH EVERY DAY AT NIGHT 90 tablet 1  . losartan (COZAAR) 100 MG tablet TAKE 1 TABLET BY MOUTH EVERY DAY 90 tablet 3  . omeprazole (PRILOSEC OTC) 20 MG tablet Take 1 tablet (20 mg total) by mouth daily. 90 tablet 3  . pravastatin (PRAVACHOL) 80 MG tablet Take 1 tablet (80 mg total) by mouth daily. 90 tablet 3  . traZODone (DESYREL) 100 MG tablet Take 1 tablet (100 mg total) by mouth at bedtime. 90 tablet 3  . HYDROcodone-acetaminophen (NORCO/VICODIN) 5-325 MG tablet Take 1 tablet by mouth every 6 (six) hours as needed. 15 tablet 0   No current facility-administered medications for this visit.    Allergies  Allergen Reactions  . Nsaids Other (See Comments)  . Penicillins Rash    Health Maintenance Health Maintenance  Topic Date Due  . COLONOSCOPY  04/21/2003  . PNA vac Low Risk Adult (1 of 2 - PCV13) 04/21/2018  . Hepatitis C Screening  01/20/2019 (Originally 02/17/1955)  . HIV Screening  01/20/2019 (Originally 04/20/1970)  .  TETANUS/TDAP  05/24/2027  . INFLUENZA VACCINE  Completed     Exam:  BP 140/88   Temp 98.1 F (36.7 C) (Oral)   Ht 6' (1.829 m)   Wt 221 lb 6.4 oz (100.4 kg)   BMI 30.03 kg/m  Wt Readings from Last 5 Encounters:  11/29/18 221 lb 6.4 oz (100.4 kg)  04/20/18 215 lb 11.2 oz (97.8 kg)  01/19/18 210 lb 9.6 oz (95.5 kg)      Gen: Well NAD HEENT: EOMI,  MMM Lungs: Normal work of breathing. CTABL Heart: RRR no MRG Abd: NABS, Soft. Nondistended, Nontender Exts: Brisk capillary refill, warm and well perfused.  Psych: Certain oriented normal speech thought process and affect.  [facility] 11/29/2018 07/28/2018 04/20/2018 01/19/2018  Decreased Interest 0 0 0 0   Down, Depressed, Hopeless 0 0 0 1  PHQ - 2 Score 0 0 0 1  Altered sleeping 1 - 1 3  Tired, decreased energy 0 - 1 0  Change in appetite 1 - 1 1  Feeling bad or failure about yourself  0 - 0 1  Trouble concentrating 0 - 0 0  Moving slowly or fidgety/restless 0 - 0 0  Suicidal thoughts 0 - 0 1  PHQ-9 Score 2 - 3 7  Difficult doing work/chores Not difficult at all - Somewhat difficult Not difficult at all       Lab and Radiology Results Recent Results (from the past 2160 hour(s))  CBC     Status: None   Collection Time: 11/24/18 10:09 AM  Result Value Ref Range   WBC 4.6 3.8 - 10.8 Thousand/uL   RBC 5.11 4.20 - 5.80 Million/uL   Hemoglobin 15.9 13.2 - 17.1 g/dL   HCT 40.947.2 81.138.5 - 91.450.0 %   MCV 92.4 80.0 - 100.0 fL   MCH 31.1 27.0 - 33.0 pg   MCHC 33.7 32.0 - 36.0 g/dL   RDW 78.213.2 95.611.0 - 21.315.0 %   Platelets 208 140 - 400 Thousand/uL   MPV 10.3 7.5 - 12.5 fL  COMPLETE METABOLIC PANEL WITH GFR     Status: None   Collection Time: 11/24/18 10:09 AM  Result Value Ref Range   Glucose, Bld 118 65 - 139 mg/dL    Comment: .        Non-fasting reference interval .    BUN 15 7 - 25 mg/dL   Creat 0.860.88 5.780.70 - 4.691.25 mg/dL    Comment: For patients >65 years of age, the reference limit for Creatinine is approximately 13% higher for people identified as African-American. .    GFR, Est Non African American 90 > OR = 60 mL/min/1.2673m2   GFR, Est African American 104 > OR = 60 mL/min/1.1373m2   BUN/Creatinine Ratio NOT APPLICABLE 6 - 22 (calc)   Sodium 137 135 - 146 mmol/L   Potassium 4.1 3.5 - 5.3 mmol/L   Chloride 103 98 - 110 mmol/L   CO2 24 20 - 32 mmol/L   Calcium 9.7 8.6 - 10.3 mg/dL   Total Protein 7.4 6.1 - 8.1 g/dL   Albumin 4.5 3.6 - 5.1 g/dL   Globulin 2.9 1.9 - 3.7 g/dL (calc)   AG Ratio 1.6 1.0 - 2.5 (calc)   Total Bilirubin 0.6 0.2 - 1.2 mg/dL   Alkaline phosphatase (APISO) 64 35 - 144 U/L   AST 28 10 - 35 U/L   ALT 43 9 - 46 U/L  Lipid panel     Status: Abnormal  Collection Time: 11/24/18 10:09 AM  Result Value Ref Range   Cholesterol 201 (H) <200 mg/dL   HDL 52 > OR = 40 mg/dL   Triglycerides 200 (H) <150 mg/dL    Comment: . If a non-fasting specimen was collected, consider repeat triglyceride testing on a fasting specimen if clinically indicated.  Yates Decamp et al. J. of Clin. Lipidol. 9833;8:250-539. Marland Kitchen    LDL Cholesterol (Calc) 117 (H) mg/dL (calc)    Comment: Reference range: <100 . Desirable range <100 mg/dL for primary prevention;   <70 mg/dL for patients with CHD or diabetic patients  with > or = 2 CHD risk factors. Marland Kitchen LDL-C is now calculated using the Martin-Hopkins  calculation, which is a validated novel method providing  better accuracy than the Friedewald equation in the  estimation of LDL-C.  Cresenciano Genre et al. Annamaria Helling. 7673;419(37): 2061-2068  (http://education.[employer].com/faq/FAQ164)    Total CHOL/HDL Ratio 3.9 <5.0 (calc)   Non-HDL Cholesterol (Calc) 149 (H) <130 mg/dL (calc)    Comment: For patients with diabetes plus 1 major ASCVD risk  factor, treating to a non-HDL-C goal of <100 mg/dL  (LDL-C of <70 mg/dL) is considered a therapeutic  option.   PSA, Total with Reflex to PSA, Free     Status: None   Collection Time: 11/24/18 10:09 AM  Result Value Ref Range   PSA, Total 1.0 < OR = 4.0 ng/mL    Comment: The Total PSA value from this assay system is  standardized against the equimolar PSA standard.  The test result will be approximately 20% higher  when compared to the [facility] Total PSA  (Siemens assay). Comparison of serial PSA results  should be interpreted with this fact in mind. Marland Kitchen PSA was performed using the Beckman Coulter  Immunoassay method. Values obtained from different  assay methods cannot be used interchangeably. PSA  levels, regardless of value, should not be  interpreted as absolute evidence of the presence or  absence of disease.   Hemoglobin A1C w/out eAG     Status: None    Collection Time: 11/24/18 10:09 AM  Result Value Ref Range   Hgb A1c MFr Bld 5.5 <5.7 % of total Hgb    Comment: For the purpose of screening for the presence of diabetes: . <5.7%       Consistent with the absence of diabetes 5.7-6.4%    Consistent with increased risk for diabetes             (prediabetes) > or =6.5%  Consistent with diabetes . This assay result is consistent with a decreased risk of diabetes. . Currently, no consensus exists regarding use of hemoglobin A1c for diagnosis of diabetes in children. . According to American Diabetes Association (ADA) guidelines, hemoglobin A1c <7.0% represents optimal control in non-pregnant diabetic patients. Different metrics may apply to specific patient populations.  Standards of Medical Care in Diabetes(ADA). .       Assessment and Plan: 63 y.o. male with  Well adult.  Doing reasonably well.  Blood pressure slightly elevated today.  Plan for home blood pressure log.  Continue chronic medications.  Labs were reasonably controlled recently.  We will request records for colonoscopy from digestive health specialists.  Administer Shingrix vaccine.  This will be his second out of a series of 2.  He is over 35 but has private health insurance.  He was cautioned that sometimes for Medicare this vaccine will not be paid for if done in the office.  He wishes to proceed  anyway.   Additionally administer Prevnar 13 pneumonia vaccine today.  Recheck 6 to 12 months with PCP.  Return sooner if needed.  Limited hydrocodone prescription sent to pharmacy.  PDMP reviewed during this encounter. Orders Placed This Encounter  Procedures  . Varicella-zoster vaccine IM (Shingrix)  . Pneumococcal conjugate vaccine 13-valent   Meds ordered this encounter  Medications  . pravastatin (PRAVACHOL) 80 MG tablet    Sig: Take 1 tablet (80 mg total) by mouth daily.    Dispense:  90 tablet    Refill:  3  . HYDROcodone-acetaminophen  (NORCO/VICODIN) 5-325 MG tablet    Sig: Take 1 tablet by mouth every 6 (six) hours as needed.    Dispense:  15 tablet    Refill:  0     Discussed warning signs or symptoms. Please see discharge instructions. Patient expresses understanding.

## 2018-12-04 ENCOUNTER — Other Ambulatory Visit: Payer: Self-pay | Admitting: Osteopathic Medicine

## 2018-12-04 DIAGNOSIS — M5416 Radiculopathy, lumbar region: Secondary | ICD-10-CM

## 2018-12-11 ENCOUNTER — Encounter: Payer: 59 | Admitting: Osteopathic Medicine

## 2019-06-15 ENCOUNTER — Other Ambulatory Visit: Payer: Self-pay | Admitting: Osteopathic Medicine

## 2019-06-15 DIAGNOSIS — M5416 Radiculopathy, lumbar region: Secondary | ICD-10-CM

## 2019-07-25 ENCOUNTER — Other Ambulatory Visit: Payer: Self-pay | Admitting: Osteopathic Medicine

## 2019-07-25 NOTE — Telephone Encounter (Signed)
[facility] pharmacy requesting med refill for alprazolam.  

## 2019-08-04 ENCOUNTER — Other Ambulatory Visit: Payer: Self-pay | Admitting: Osteopathic Medicine

## 2019-08-17 ENCOUNTER — Other Ambulatory Visit: Payer: Self-pay | Admitting: Osteopathic Medicine

## 2019-11-11 ENCOUNTER — Other Ambulatory Visit: Payer: Self-pay | Admitting: Osteopathic Medicine

## 2019-11-11 DIAGNOSIS — I10 Essential (primary) hypertension: Secondary | ICD-10-CM

## 2019-11-16 ENCOUNTER — Other Ambulatory Visit: Payer: Self-pay | Admitting: Family Medicine

## 2019-12-05 ENCOUNTER — Ambulatory Visit (INDEPENDENT_AMBULATORY_CARE_PROVIDER_SITE_OTHER): Payer: 59 | Admitting: Osteopathic Medicine

## 2019-12-05 ENCOUNTER — Encounter: Payer: Self-pay | Admitting: Osteopathic Medicine

## 2019-12-05 VITALS — BP 135/89 | HR 93 | Temp 97.9°F | Wt 221.0 lb

## 2019-12-05 DIAGNOSIS — E782 Mixed hyperlipidemia: Secondary | ICD-10-CM | POA: Diagnosis not present

## 2019-12-05 DIAGNOSIS — I10 Essential (primary) hypertension: Secondary | ICD-10-CM | POA: Diagnosis not present

## 2019-12-05 DIAGNOSIS — Z125 Encounter for screening for malignant neoplasm of prostate: Secondary | ICD-10-CM

## 2019-12-05 DIAGNOSIS — Z0000 Encounter for general adult medical examination without abnormal findings: Secondary | ICD-10-CM | POA: Diagnosis not present

## 2019-12-05 DIAGNOSIS — Z136 Encounter for screening for cardiovascular disorders: Secondary | ICD-10-CM

## 2019-12-05 DIAGNOSIS — Z1211 Encounter for screening for malignant neoplasm of colon: Secondary | ICD-10-CM

## 2019-12-05 DIAGNOSIS — Z87891 Personal history of nicotine dependence: Secondary | ICD-10-CM

## 2019-12-05 DIAGNOSIS — R7301 Impaired fasting glucose: Secondary | ICD-10-CM

## 2019-12-05 NOTE — Progress Notes (Signed)
HPI: Steven Hebert is a 64 y.o. male who  has a past medical history of Alcohol abuse, H/O degenerative disc disease, High blood pressure, and High cholesterol.  he presents to [facility] today, 12/05/19,  for chief complaint of: Annual physical      Past medical, surgical, social and family history reviewed:  Patient Active Problem List   Diagnosis Date Noted   Anxiety 01/19/2018   Cervical radiculitis 01/19/2018   DDD (degenerative disc disease), cervical 01/19/2018   Esophageal reflux 01/19/2018   Essential hypertension 01/19/2018   Hiatal hernia 01/19/2018   Low back pain 01/19/2018   Meralgia paresthetica of left side 01/19/2018   Mixed hyperlipidemia 01/19/2018   Sleep disorder 01/19/2018   Chronic prescription benzodiazepine use 11/24/2017   Diverticulosis of colon 06/08/2013   Left lumbar radiculopathy 05/24/2012   Essential tremor 10/01/2010    Past Surgical History:  Procedure Laterality Date   ACHILLES TENDON REPAIR     APPENDECTOMY     HERNIA REPAIR     NOSE SURGERY      Social History   Tobacco Use   Smoking status: Former Smoker    Packs/day: 0.50    Years: 10.00    Pack years: 5.00    Types: Cigarettes    Quit date: 1977    Years since quitting: 44.8   Smokeless tobacco: Never Used  Substance Use Topics   Alcohol use: Yes    Alcohol/week: 28.0 standard drinks    Types: 28 Cans of beer per week    No family history on file.   Current medication list and allergy/intolerance information reviewed:    Current Outpatient Medications  Medication Sig Dispense Refill   acamprosate (CAMPRAL) 333 MG tablet TAKE 2 TABLETS BY MOUTH TWICE A DAY 120 tablet 2   ALPRAZolam (XANAX) 0.5 MG tablet TAKE 0.5-1 TABLETS (0.25-0.5 MG TOTAL) BY MOUTH 2 (TWO) TIMES DAILY AS NEEDED FOR ANXIETY OR SLEEP. 30 tablet 0   diclofenac sodium (VOLTAREN) 1 % GEL Apply 4 g topically 4 (four) times daily. To affected  joint. 100 g 11   gabapentin (NEURONTIN) 600 MG tablet TAKE 1 TABLET BY MOUTH EVERY DAY AT NIGHT 90 tablet 0   HYDROcodone-acetaminophen (NORCO/VICODIN) 5-325 MG tablet Take 1 tablet by mouth every 6 (six) hours as needed. 15 tablet 0   losartan (COZAAR) 100 MG tablet TAKE 1 TABLET BY MOUTH EVERY DAY 90 tablet 3   omeprazole (PRILOSEC OTC) 20 MG tablet Take 1 tablet (20 mg total) by mouth daily. 90 tablet 3   pravastatin (PRAVACHOL) 80 MG tablet Take 1 tablet (80 mg total) by mouth daily. 90 tablet 3   traZODone (DESYREL) 100 MG tablet TAKE 1 TABLET BY MOUTH EVERYDAY AT BEDTIME 90 tablet 0   No current facility-administered medications for this visit.    Allergies  Allergen Reactions   Nsaids Other (See Comments)   Penicillins Rash       Exam:  BP 135/89 (BP Location: Left Arm, Patient Position: Sitting, Cuff Size: Normal)    Pulse 93    Temp 97.9 F (36.6 C) (Oral)    Wt 221 lb 0.6 oz (100.3 kg)    BMI 29.98 kg/m   Constitutional: VS see above. General Appearance: alert, well-developed, well-nourished, NAD  Eyes: Normal lids and conjunctive, non-icteric sclera  Ears, Nose, Mouth, Throat: MMM, Normal external inspection ears/nares/mouth/lips/gums. TM normal bilaterally. Pharynx/tonsils no erythema, no exudate. Nasal mucosa normal.   Neck: No masses, trachea  midline. No thyroid enlargement. No tenderness/mass appreciated. No lymphadenopathy  Respiratory: Normal respiratory effort. no wheeze, no rhonchi, no rales  Cardiovascular: S1/S2 normal, no murmur, no rub/gallop auscultated. RRR. No lower extremity edema. Pedal pulse II/IV bilaterally DP and PT. No carotid bruit or JVD. No abdominal aortic bruit.  Gastrointestinal: Nontender, no masses. No hepatomegaly, no splenomegaly. No hernia appreciated. Bowel sounds normal. Rectal exam deferred.   Musculoskeletal: Gait normal. No clubbing/cyanosis of digits.   Neurological: Normal balance/coordination. No tremor. No cranial  nerve deficit on limited exam. Motor and sensation intact and symmetric. Cerebellar reflexes intact.   Skin: warm, dry, intact. No rash/ulcer. No concerning nevi or subq nodules on limited exam.    Psychiatric: Normal judgment/insight. Normal mood and affect. Oriented x3.    No results found for this or any previous visit (from the past 72 hour(s)).  No results found.   ASSESSMENT/PLAN: The primary encounter diagnosis was Annual physical exam. Diagnoses of Essential hypertension, Mixed hyperlipidemia, Prostate cancer screening, Colon cancer screening, Encounter for abdominal aortic aneurysm (AAA) screening, Elevated fasting glucose, and Former smoker were also pertinent to this visit.   Orders Placed This Encounter  Procedures   [place] [facility] MEDICARE SCREENING   CBC   COMPLETE METABOLIC PANEL WITH GFR   Lipid panel   Hemoglobin A1c   PSA, Total with Reflex to PSA, Free   Hepatitis C antibody    No orders of the defined types were placed in this encounter.   Patient Instructions  General Preventive Care  Most recent routine screening labs: ordered today.   Blood pressure goal 130/80 or less.   Tobacco: don't!   Alcohol: responsible moderation is ok for most adults - if you have concerns about your alcohol intake, please talk to me!   Exercise: as tolerated to reduce risk of cardiovascular disease and diabetes. Strength training will also prevent osteoporosis.   Mental health: if need for mental health care (medicines, counseling, other), or concerns about moods, please let me know!   Sexual / Reproductive health: if need for STD testing, or if concerns with libido/pain problems, please let me know!   Advanced Directive: Living Will and/or Healthcare Power of Attorney recommended for all adults, regardless of age or health.  Vaccines  Flu vaccine: every fall.   Shingles vaccine: done  Pneumonia vaccines: booster due in 2023, then done!   Tetanus booster: every  10 years - due 2029   COVID vaccine: THANKS for getting your vaccine! :)  Cancer screenings   Colon cancer screening: for everyone age 75-75. We never received records, will send another request for these.   Prostate cancer screening: PSA blood test age 18-71  Lung cancer screening: not needed since you quit more than 15 years ago  Infection screenings   HIV: recommended screening at least once age 32-65, more often as needed.  Gonorrhea/Chlamydia: screening as needed  Hepatitis C: recommended once for everyone age 4-75  TB: certain at-risk populations, or depending on work requirements and/or travel history Other  Bone Density Test: recommended for men at age 26  Abdominal Aortic Aneurysm: screening with ultrasound recommended once for men age 64-75 who have ever smoked        Visit summary with medication list and pertinent instructions was printed for patient to review. All questions at time of visit were answered - patient instructed to contact office with any additional concerns or updates. ER/RTC precautions were reviewed with the patient.     Please note: voice  recognition software was used to produce this document, and typos may escape review. Please contact Dr. Lyn Hollingshead for any needed clarifications.     Follow-up plan: Return in about 1 year (around 12/04/2020) for [employer] - SEE [place] SOONER IF NEEDED.

## 2019-12-05 NOTE — Patient Instructions (Addendum)
General Preventive Care  Most recent routine screening labs: ordered today.   Blood pressure goal 130/80 or less.   Tobacco: don't!   Alcohol: responsible moderation is ok for most adults - if you have concerns about your alcohol intake, please talk to me!   Exercise: as tolerated to reduce risk of cardiovascular disease and diabetes. Strength training will also prevent osteoporosis.   Mental health: if need for mental health care (medicines, counseling, other), or concerns about moods, please let me know!   Sexual / Reproductive health: if need for STD testing, or if concerns with libido/pain problems, please let me know!   Advanced Directive: Living Will and/or Healthcare Power of Attorney recommended for all adults, regardless of age or health.  Vaccines  Flu vaccine: every fall.   Shingles vaccine: done  Pneumonia vaccines: booster due in 2023, then done!   Tetanus booster: every 10 years - due 2029   COVID vaccine: THANKS for getting your vaccine! :)  Cancer screenings   Colon cancer screening: for everyone age 22-75. We never received records, will send another request for these.   Prostate cancer screening: PSA blood test age 61-71  Lung cancer screening: not needed since you quit more than 15 years ago  Infection screenings  . HIV: recommended screening at least once age 74-65, more often as needed. . Gonorrhea/Chlamydia: screening as needed . Hepatitis C: recommended once for everyone age 66-75 . TB: certain at-risk populations, or depending on work requirements and/or travel history Other . Bone Density Test: recommended for men at age 45 . Abdominal Aortic Aneurysm: screening with ultrasound recommended once for men age 46-75 who have ever smoked

## 2019-12-11 ENCOUNTER — Ambulatory Visit: Payer: 59

## 2019-12-12 ENCOUNTER — Other Ambulatory Visit: Payer: Self-pay | Admitting: Family Medicine

## 2020-01-08 ENCOUNTER — Ambulatory Visit: Payer: 59

## 2020-02-05 ENCOUNTER — Other Ambulatory Visit: Payer: Self-pay

## 2020-02-05 ENCOUNTER — Ambulatory Visit (INDEPENDENT_AMBULATORY_CARE_PROVIDER_SITE_OTHER): Payer: Medicare Other

## 2020-02-05 DIAGNOSIS — Z136 Encounter for screening for cardiovascular disorders: Secondary | ICD-10-CM

## 2020-02-06 LAB — COMPLETE METABOLIC PANEL WITH GFR
AG Ratio: 1.6 (calc) (ref 1.0–2.5)
ALT: 50 U/L — ABNORMAL HIGH (ref 9–46)
AST: 30 U/L (ref 10–35)
Albumin: 4.4 g/dL (ref 3.6–5.1)
Alkaline phosphatase (APISO): 70 U/L (ref 35–144)
BUN: 13 mg/dL (ref 7–25)
CO2: 26 mmol/L (ref 20–32)
Calcium: 9.4 mg/dL (ref 8.6–10.3)
Chloride: 102 mmol/L (ref 98–110)
Creat: 0.87 mg/dL (ref 0.70–1.25)
GFR, Est African American: 104 mL/min/{1.73_m2} (ref >=60)
GFR, Est Non African American: 90 mL/min/{1.73_m2} (ref >=60)
Globulin: 2.8 g/dL (calc) (ref 1.9–3.7)
Glucose, Bld: 102 mg/dL — ABNORMAL HIGH (ref 65–99)
Potassium: 4.1 mmol/L (ref 3.5–5.3)
Sodium: 137 mmol/L (ref 135–146)
Total Bilirubin: 0.6 mg/dL (ref 0.2–1.2)
Total Protein: 7.2 g/dL (ref 6.1–8.1)

## 2020-02-06 LAB — CBC
HCT: 48.3 % (ref 38.5–50.0)
Hemoglobin: 16.3 g/dL (ref 13.2–17.1)
MCH: 31.4 pg (ref 27.0–33.0)
MCHC: 33.7 g/dL (ref 32.0–36.0)
MCV: 93.1 fL (ref 80.0–100.0)
MPV: 10.6 fL (ref 7.5–12.5)
Platelets: 174 10*3/uL (ref 140–400)
RBC: 5.19 10*6/uL (ref 4.20–5.80)
RDW: 13 % (ref 11.0–15.0)
WBC: 3.6 10*3/uL — ABNORMAL LOW (ref 3.8–10.8)

## 2020-02-06 LAB — LIPID PANEL
Cholesterol: 221 mg/dL — ABNORMAL HIGH (ref <200)
HDL: 49 mg/dL (ref >=40)
LDL Cholesterol (Calc): 130 mg/dL (calc) — ABNORMAL HIGH
Non-HDL Cholesterol (Calc): 172 mg/dL (calc) — ABNORMAL HIGH (ref <130)
Total CHOL/HDL Ratio: 4.5 (calc) (ref <5.0)
Triglycerides: 271 mg/dL — ABNORMAL HIGH (ref <150)

## 2020-02-06 LAB — HEMOGLOBIN A1C
Hgb A1c MFr Bld: 5.7 % of total Hgb — ABNORMAL HIGH (ref <5.7)
Mean Plasma Glucose: 117 mg/dL
eAG (mmol/L): 6.5 mmol/L

## 2020-02-06 LAB — PSA, TOTAL WITH REFLEX TO PSA, FREE: PSA, Total: 1.5 ng/mL (ref <=4.0)

## 2020-02-06 LAB — HEPATITIS C ANTIBODY
Hepatitis C Ab: NONREACTIVE
SIGNAL TO CUT-OFF: 0 (ref <1.00)

## 2020-02-06 NOTE — Telephone Encounter (Signed)
This encounter was created in error - please disregard.

## 2020-02-11 ENCOUNTER — Other Ambulatory Visit: Payer: Self-pay | Admitting: *Deleted

## 2020-02-11 DIAGNOSIS — R748 Abnormal levels of other serum enzymes: Secondary | ICD-10-CM

## 2020-02-25 ENCOUNTER — Telehealth: Payer: Self-pay

## 2020-02-25 NOTE — Telephone Encounter (Signed)
Routing to covering provider.  Pt called requesting information for detox and in-patient rehabilitation. Pt did not give reason for request.

## 2020-02-25 NOTE — Telephone Encounter (Signed)
He can seek detox/in-patient rehab services by going to the [facility] hospital in [place], [facility] in [place], or by contacting [facility] on [address].

## 2020-02-26 NOTE — Telephone Encounter (Addendum)
Pt was occupied at time of call. Unable to write down any of the requested information. Pt will return a call back to the clinic when possible.   [facility] [address] [facility] Outpatient services - [phone] Yvetta Coder [facility] services - [phone] [facility]- [phone]

## 2020-02-27 NOTE — Telephone Encounter (Signed)
Task completed. Pt was updated of facilities information and phone numbers. No other inquiries during the call.

## 2020-02-28 ENCOUNTER — Other Ambulatory Visit: Payer: Self-pay | Admitting: Osteopathic Medicine

## 2020-03-17 LAB — HEPATIC FUNCTION PANEL
AG Ratio: 1.6 (calc) (ref 1.0–2.5)
ALT: 52 U/L — ABNORMAL HIGH (ref 9–46)
AST: 34 U/L (ref 10–35)
Albumin: 4.5 g/dL (ref 3.6–5.1)
Alkaline phosphatase (APISO): 69 U/L (ref 35–144)
Bilirubin, Direct: 0.2 mg/dL (ref 0.0–0.2)
Globulin: 2.8 g/dL (calc) (ref 1.9–3.7)
Indirect Bilirubin: 0.7 mg/dL (calc) (ref 0.2–1.2)
Total Bilirubin: 0.9 mg/dL (ref 0.2–1.2)
Total Protein: 7.3 g/dL (ref 6.1–8.1)

## 2020-03-17 LAB — HEPATITIS PANEL, ACUTE
Hep A IgM: NONREACTIVE
Hep B C IgM: NONREACTIVE
Hepatitis B Surface Ag: NONREACTIVE
Hepatitis C Ab: NONREACTIVE
SIGNAL TO CUT-OFF: 0 (ref <1.00)

## 2020-05-28 LAB — PSA, TOTAL WITH REFLEX TO PSA, FREE: PSA, Total: 1.8 ng/mL (ref <=4.0)

## 2020-06-03 ENCOUNTER — Telehealth: Payer: Self-pay

## 2020-06-03 ENCOUNTER — Ambulatory Visit (INDEPENDENT_AMBULATORY_CARE_PROVIDER_SITE_OTHER): Payer: Medicare Other | Admitting: Osteopathic Medicine

## 2020-06-03 ENCOUNTER — Other Ambulatory Visit: Payer: Self-pay

## 2020-06-03 VITALS — BP 140/90 | HR 94

## 2020-06-03 DIAGNOSIS — I10 Essential (primary) hypertension: Secondary | ICD-10-CM

## 2020-06-03 NOTE — Telephone Encounter (Signed)
Pt left a vm msg stating he was under the impression that he had an appt with provider today. Pt was very upset that his appt was a NV for a bp check. He mentioned that he is considering another location for care. Pt was not happy with having a NV. He mentioned that it was a waste of time for him.

## 2020-06-03 NOTE — Progress Notes (Signed)
Established Patient Office Visit  Subjective:  Patient ID: Steven Hebert, male    DOB: Sep 13, 1955  Age: 65 y.o. MRN: [id number]  CC:  Chief Complaint  Patient presents with  . Hypertension    HPI Steven Hebert presents for blood pressure check. Denies chest pain, shortness of breath or headaches.   Past Medical History:  Diagnosis Date  . Alcohol abuse   . H/O degenerative disc disease   . High blood pressure   . High cholesterol     Past Surgical History:  Procedure Laterality Date  . ACHILLES TENDON REPAIR    . APPENDECTOMY    . HERNIA REPAIR    . NOSE SURGERY      History reviewed. No pertinent family history.  Social History   Socioeconomic History  . Marital status: Married    Spouse name: Not on file  . Number of children: Not on file  . Years of education: Not on file  . Highest education level: Not on file  Occupational History  . Occupation: PROJECT SUPERVISOR     Employer: [employer]  Tobacco Use  . Smoking status: Former Smoker    Packs/day: 0.50    Years: 10.00    Pack years: 5.00    Types: Cigarettes    Quit date: 1977    Years since quitting: 45.3  . Smokeless tobacco: Never Used  Vaping Use  . Vaping Use: Never used  Substance and Sexual Activity  . Alcohol use: Yes    Alcohol/week: 28.0 standard drinks    Types: 28 Cans of beer per week  . Drug use: Not Currently    Types: Marijuana  . Sexual activity: Yes    Partners: Female, Male  Other Topics Concern  . Not on file  Social History Narrative  . Not on file   Social Determinants of Health   Financial Resource Strain: Not on file  Food Insecurity: Not on file  Transportation Needs: Not on file  Physical Activity: Not on file  Stress: Not on file  Social Connections: Not on file  Intimate Partner Violence: Not on file    Outpatient Medications Prior to Visit  Medication Sig Dispense Refill  . acamprosate (CAMPRAL) 333 MG tablet TAKE 2 TABLETS BY MOUTH TWICE A  DAY 120 tablet 2  . ALPRAZolam (XANAX) 0.5 MG tablet TAKE 0.5-1 TABLETS (0.25-0.5 MG TOTAL) BY MOUTH 2 (TWO) TIMES DAILY AS NEEDED FOR ANXIETY OR SLEEP. 30 tablet 0  . diclofenac sodium (VOLTAREN) 1 % GEL Apply 4 g topically 4 (four) times daily. To affected joint. 100 g 11  . gabapentin (NEURONTIN) 600 MG tablet TAKE 1 TABLET BY MOUTH EVERY DAY AT NIGHT 90 tablet 0  . HYDROcodone-acetaminophen (NORCO/VICODIN) 5-325 MG tablet Take 1 tablet by mouth every 6 (six) hours as needed. 15 tablet 0  . losartan (COZAAR) 100 MG tablet TAKE 1 TABLET BY MOUTH EVERY DAY 90 tablet 3  . omeprazole (PRILOSEC OTC) 20 MG tablet Take 1 tablet (20 mg total) by mouth daily. 90 tablet 3  . pravastatin (PRAVACHOL) 80 MG tablet TAKE 1 TABLET BY MOUTH EVERY DAY 90 tablet 3  . traZODone (DESYREL) 100 MG tablet TAKE 1 TABLET EVERY NIGHT AT BEDTIME 90 tablet 2   No facility-administered medications prior to visit.    Allergies  Allergen Reactions  . Nsaids Other (See Comments)  . Penicillins Rash    ROS Review of Systems    Objective:    Physical Exam  BP (!) 150/84   Pulse 94   SpO2 98%  Wt Readings from Last 3 Encounters:  12/05/19 221 lb 0.6 oz (100.3 kg)  11/29/18 221 lb 6.4 oz (100.4 kg)  04/20/18 215 lb 11.2 oz (97.8 kg)     Health Maintenance Due  Topic Date Due  . COLONOSCOPY (Pts 45-79yrs Insurance coverage will need to be confirmed)  Never done  . COVID-19 Vaccine (3 - Booster) 10/04/2019    There are no preventive care reminders to display for this patient.  No results found for: TSH Lab Results  Component Value Date   WBC 3.6 (L) 02/05/2020   HGB 16.3 02/05/2020   HCT 48.3 02/05/2020   MCV 93.1 02/05/2020   PLT 174 02/05/2020   Lab Results  Component Value Date   NA 137 02/05/2020   K 4.1 02/05/2020   CO2 26 02/05/2020   GLUCOSE 102 (H) 02/05/2020   BUN 13 02/05/2020   CREATININE 0.87 02/05/2020   BILITOT 0.9 03/14/2020   AST 34 03/14/2020   ALT 52 (H) 03/14/2020    PROT 7.3 03/14/2020   CALCIUM 9.4 02/05/2020   Lab Results  Component Value Date   CHOL 221 (H) 02/05/2020   Lab Results  Component Value Date   HDL 49 02/05/2020   Lab Results  Component Value Date   LDLCALC 130 (H) 02/05/2020   Lab Results  Component Value Date   TRIG 271 (H) 02/05/2020   Lab Results  Component Value Date   CHOLHDL 4.5 02/05/2020   Lab Results  Component Value Date   HGBA1C 5.7 (H) 02/05/2020      Assessment & Plan:  HTN - Second blood pressure check was within normal limits. Patient advised to continue current medications as directed. Follow up with an annual visit with Dr Lyn Hollingshead in 6 months.    Problem List Items Addressed This Visit    Essential hypertension - Primary      No orders of the defined types were placed in this encounter.   Follow-up: Return in about 6 months (around 12/04/2020) for Annual exam with Dr Lyn Hollingshead. Earna Coder, Janalyn Harder, CMA

## 2020-06-05 NOTE — Telephone Encounter (Signed)
Was my impression Marylene Land tried to address this with him but he left the office without engaging in a full discussion. I think this was a scheduling error, I didn't NEED to see him until next annual - see last progress note. If he is unwilling to address this in a professional manner, he is free to seek care elsewhere and we will be happy to forward records. Routine to practice admin re: patient complaint / FYI

## 2020-06-11 ENCOUNTER — Other Ambulatory Visit: Payer: Self-pay | Admitting: Osteopathic Medicine

## 2020-09-26 ENCOUNTER — Other Ambulatory Visit: Payer: Self-pay

## 2020-09-26 ENCOUNTER — Ambulatory Visit (INDEPENDENT_AMBULATORY_CARE_PROVIDER_SITE_OTHER): Payer: Medicare Other | Admitting: Osteopathic Medicine

## 2020-09-26 ENCOUNTER — Telehealth: Payer: Self-pay | Admitting: Osteopathic Medicine

## 2020-09-26 VITALS — BP 141/83 | HR 86 | Ht 72.0 in | Wt 215.0 lb

## 2020-09-26 DIAGNOSIS — Z0000 Encounter for general adult medical examination without abnormal findings: Secondary | ICD-10-CM | POA: Diagnosis not present

## 2020-09-26 MED ORDER — AMBULATORY NON FORMULARY MEDICATION: 99 refills | Status: DC

## 2020-09-26 NOTE — Progress Notes (Signed)
MEDICARE ANNUAL WELLNESS VISIT  09/26/2020  Subjective:  Steven Hebert is a 65 y.o. male patient of Sunnie Nielsenlexander, Natalie, DO who had a Medicare Annual Wellness Visit today. Steven Hebert is Working part time and lives with their spouse. he has 2 children. he reports that he is socially active and does interact with friends/family regularly. he is moderately physically active and enjoys woodworking.  Patient Care Team: Sunnie NielsenAlexander, Natalie, DO as PCP - General (Osteopathic Medicine)  Advanced Directives 09/26/2020  Does Patient Have a Medical Advance Directive? Yes  Type of Advance Directive Living will  Does patient want to make changes to medical advance directive? No - Patient declined    Hospital Utilization Over Steven Past 12 Months: # of hospitalizations or ER visits: 0 # of surgeries: 0  Review of Systems    Patient reports that his overall health is better when compared to last year.  Review of Systems: History obtained from chart review and Steven patient  All other systems negative.  Pain Assessment Pain : No/denies pain     Current Medications & Allergies (verified) Allergies as of 09/26/2020       Reactions   Nsaids Other (See Comments)   Penicillins Rash        Medication List        Accurate as of September 26, 2020 10:39 AM. If you have any questions, ask your nurse or doctor.          acamprosate 333 MG tablet Commonly known as: CAMPRAL TAKE 2 TABLETS BY MOUTH TWICE A DAY   ALPRAZolam 0.5 MG tablet Commonly known as: XANAX TAKE 0.5-1 TABLETS (0.25-0.5 MG TOTAL) BY MOUTH 2 (TWO) TIMES DAILY AS NEEDED FOR ANXIETY OR SLEEP.   AMBULATORY NON FORMULARY MEDICATION HEPATITIS PANEL WAS ORDERED APPROPRIATELY PER STANDARD OF PRACTIVE IN ORDER TO EVALUATE ELEVATED LIVER ENZYMES Started by: Sunnie NielsenNatalie Alexander, DO   amitriptyline 25 MG tablet Commonly known as: ELAVIL Take 25-50 mg by mouth daily as needed.   buPROPion 150 MG 24 hr tablet Commonly known as:  WELLBUTRIN XL Take 150 mg by mouth daily.   celecoxib 200 MG capsule Commonly known as: CELEBREX Take 200 mg by mouth 2 (two) times daily.   diclofenac sodium 1 % Gel Commonly known as: VOLTAREN Apply 4 g topically 4 (four) times daily. To affected joint.   gabapentin 600 MG tablet Commonly known as: NEURONTIN TAKE 1 TABLET BY MOUTH EVERY DAY AT NIGHT   HYDROcodone-acetaminophen 5-325 MG tablet Commonly known as: NORCO/VICODIN Take 1 tablet by mouth every 6 (six) hours as needed.   losartan 100 MG tablet Commonly known as: COZAAR TAKE 1 TABLET BY MOUTH EVERY DAY   omeprazole 20 MG tablet Commonly known as: PRILOSEC OTC Take 1 tablet (20 mg total) by mouth daily.   pravastatin 80 MG tablet Commonly known as: PRAVACHOL TAKE 1 TABLET BY MOUTH EVERY DAY   traZODone 100 MG tablet Commonly known as: DESYREL TAKE 1 TABLET EVERY NIGHT AT BEDTIME        History (reviewed): Past Medical History:  Diagnosis Date   Alcohol abuse    H/O degenerative disc disease    High blood pressure    High cholesterol    Past Surgical History:  Procedure Laterality Date   ACHILLES TENDON REPAIR     APPENDECTOMY     HERNIA REPAIR     NOSE SURGERY     History reviewed. No pertinent family history. Social History   Socioeconomic History   Marital  status: Married    Spouse name: Verlon Au   Number of children: 2   Years of education: 14   Highest education level: Some college, no degree  Occupational History   Occupation: Pharmacist, hospital: [employer]    Comment: Part time  Tobacco Use   Smoking status: Former    Packs/day: 0.50    Years: 10.00    Pack years: 5.00    Types: Cigarettes    Quit date: 1977    Years since quitting: 45.6   Smokeless tobacco: Never  Vaping Use   Vaping Use: Never used  Substance and Sexual Activity   Alcohol use: Yes    Alcohol/week: 28.0 standard drinks    Types: 28 Cans of beer per week    Comment: Quit 07/29/20 (had  wine 09/20/20)   Drug use: Not Currently    Types: Marijuana   Sexual activity: Yes    Partners: Female, Male  Other Topics Concern   Not on file  Social History Narrative   Lives with his wife. He is working part-time. He enjoys woodworking.   Social Determinants of Health   Financial Resource Strain: Low Risk    Difficulty of Paying Living Expenses: Not hard at all  Food Insecurity: No Food Insecurity   Worried About Programme researcher, broadcasting/film/video in Steven Last Year: Never true   Ran Out of Food in Steven Last Year: Never true  Transportation Needs: No Transportation Needs   Lack of Transportation (Medical): No   Lack of Transportation (Non-Medical): No  Physical Activity: Sufficiently Active   Days of Exercise per Week: 5 days   Minutes of Exercise per Session: 50 min  Stress: No Stress Concern Present   Feeling of Stress : Not at all  Social Connections: Moderately Integrated   Frequency of Communication with Friends and Family: More than three times a week   Frequency of Social Gatherings with Friends and Family: Once a week   Attends Religious Services: 1 to 4 times per year   Active Member of [employer] or Organizations: No   Attends Banker Meetings: Never   Marital Status: Married    Activities of Daily Living In your present state of health, do you have any difficulty performing Steven following activities: 09/26/2020  Hearing? 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  Vision? N  Difficulty concentrating or making decisions? N  Walking or climbing stairs? N  Dressing or bathing? N  Doing errands, shopping? N  Preparing Food and eating ? N  Using Steven Toilet? N  In Steven past six months, have you accidently leaked urine? N  Do you have problems with loss of bowel control? N  Managing your Medications? N  Managing your Finances? N  Housekeeping or managing your Housekeeping? N  Some recent data might be hidden    Patient Education/Literacy How often do you need to have  someone help you when you read instructions, pamphlets, or other written materials from your doctor or pharmacy?: 1 - Never What is Steven last grade level you completed in school?: Trade school (college)  Exercise Current Exercise Habits: Home exercise routine, Type of exercise: Other - see comments;strength training/weights (elliptical), Time (Minutes): 50, Frequency (Times/Week): 5, Weekly Exercise (Minutes/Week): 250, Intensity: Moderate, Exercise limited by: None identified  Diet Patient reports consuming 3 meals a day and 2 snack(s) a day Patient reports that his primary diet is: Regular Patient reports that she does not have regular  access to food.   Depression Screen PHQ 2/9 Scores 09/26/2020 12/05/2019 11/29/2018 07/28/2018 04/20/2018 01/19/2018  PHQ - 2 Score 0 0 0 0 0 1  PHQ- 9 Score - - 2 - 3 7     Fall Risk Fall Risk  09/26/2020 12/05/2019  Falls in Steven past year? 0 0  Number falls in past yr: 0 -  Injury with Fall? 0 -  Risk for fall due to : No Fall Risks -  Follow up Falls evaluation completed -     Objective:   BP (!) 141/83 (BP Location: Right Arm, Patient Position: Sitting, Cuff Size: Normal)   Pulse 86   Ht 6' (1.829 m)   Wt 215 lb (97.5 kg)   SpO2 97%   BMI 29.16 kg/m   Last Weight  Most recent update: 09/26/2020 10:01 AM    Weight  97.5 kg (215 lb)             Body mass index is 29.16 kg/m.  Hearing/Vision  Zayquan did not have difficulty with hearing/understanding during Steven face-to-face interview Steven Hebert did not have difficulty with his vision during Steven face-to-face interview Reports that he has had a formal eye exam by an eye care professional within Steven past year Reports that he has not had a formal hearing evaluation within Steven past year  Cognitive Function: 6CIT Screen 09/26/2020  What Year? 0 points  What month? 0 points  What time? 0 points  Count back from 20 0 points  Months in reverse 0 points  Repeat phrase 0 points  Total Score 0     Normal Cognitive Function Screening: Yes (Normal:0-7, Significant for Dysfunction: >8)  Immunization & Health Maintenance Record Immunization History  Administered Date(s) Administered   Influenza Split 12/17/2009, 01/05/2011, 11/11/2011, 10/25/2012, 11/03/2016, 11/01/2017, 11/24/2017   Influenza, High Dose Seasonal PF 10/12/2018   Influenza, Seasonal, Injecte, Preservative Fre 12/25/2013, 11/19/2014, 10/29/2015, 11/09/2017   Influenza,inj,Quad PF,6+ Mos 11/03/2016, 11/24/2017   Influenza-Unspecified 01/05/2011, 11/11/2011, 10/25/2012, 11/03/2016, 11/24/2017, 11/14/2019   Moderna SARS-COV2 Booster Vaccination 01/03/2020   Pneumococcal Conjugate-13 11/29/2018   Pneumococcal Polysaccharide-23 04/28/2016   Tdap 04/28/2015, 05/23/2017   Unspecified SARS-COV-2 Vaccination 03/06/2019, 04/03/2019   Zoster Recombinat (Shingrix) 11/24/2017, 11/29/2018    Health Maintenance  Topic Date Due   COVID-19 Vaccine (4 - Booster) 10/12/2020 (Originally 04/02/2020)   INFLUENZA VACCINE  12/01/2020 (Originally 09/01/2020)   PNA vac Low Risk Adult (2 of 2 - PPSV23) 04/28/2021   COLONOSCOPY (Pts 45-49yrs Insurance coverage will need to be confirmed)  08/15/2022   TETANUS/TDAP  05/24/2027   Hepatitis C Screening  Completed   Zoster Vaccines- Shingrix  Completed   HPV VACCINES  Aged Out       Assessment  This is a routine wellness examination for Steven Hebert.  Health Maintenance: Due or Overdue There are no preventive care reminders to display for this patient.   Steven Hebert does not need a referral for [employer] Assistance: Care Management:   no Social Work:    no Prescription Assistance:  no Nutrition/Diabetes Education:  no   Plan:  Personalized Goals  Goals Addressed               This Visit's Progress     Patient Stated (pt-stated)        Recently quit alcohol and he would like to make sure that he doesn't drink like before and is only drinking on special occasions.        Personalized Health  Maintenance & Screening Recommendations  Influenza vaccine Colorectal cancer screening - patient stated he had one in July 2014.  Lung Cancer Screening Recommended: no (Low Dose CT Chest recommended if Age 43-80 years, 30 pack-year currently smoking OR have quit w/in past 15 years) Hepatitis C Screening recommended: no HIV Screening recommended: no  Advanced Directives: Written information was not given per Steven patient's request.  Referrals & Orders No orders of Steven defined types were placed in this encounter.   Follow-up Plan Follow-up with Sunnie Nielsen, DO as planned Schedule your flu vaccine. Medicare wellness in one year. AVS printed and given to Steven patient.   I have personally reviewed and noted Steven following in Steven patient's chart:   Medical and social history Use of alcohol, tobacco or illicit drugs  Current medications and supplements Functional ability and status Nutritional status Physical activity Advanced directives List of other physicians Hospitalizations, surgeries, and ER visits in previous 12 months Vitals Screenings to include cognitive, depression, and falls Referrals and appointments  In addition, I have reviewed and discussed with patient certain preventive protocols, quality metrics, and best practice recommendations. A written personalized care plan for preventive services as well as general preventive health recommendations were provided to patient.     Modesto Charon, RN  09/26/2020

## 2020-09-26 NOTE — Patient Instructions (Addendum)
MEDICARE ANNUAL WELLNESS VISIT Health Maintenance Summary and Written Plan of Care  Steven Hebert ,  Thank you for allowing me to perform your Medicare Annual Wellness Visit and for your ongoing commitment to your health.   Health Maintenance & Immunization History Health Maintenance  Topic Date Due   COVID-19 Vaccine (4 - Booster) 10/12/2020 (Originally 04/02/2020)   INFLUENZA VACCINE  12/01/2020 (Originally 09/01/2020)   PNA vac Low Risk Adult (2 of 2 - PPSV23) 04/28/2021   COLONOSCOPY (Pts 45-73yrs Insurance coverage will need to be confirmed)  08/15/2022   TETANUS/TDAP  05/24/2027   Hepatitis C Screening  Completed   Zoster Vaccines- Shingrix  Completed   HPV VACCINES  Aged Out   Immunization History  Administered Date(s) Administered   Influenza Split 12/17/2009, 01/05/2011, 11/11/2011, 10/25/2012, 11/03/2016, 11/01/2017, 11/24/2017   Influenza, High Dose Seasonal PF 10/12/2018   Influenza, Seasonal, Injecte, Preservative Fre 12/25/2013, 11/19/2014, 10/29/2015, 11/09/2017   Influenza,inj,Quad PF,6+ Mos 11/03/2016, 11/24/2017   Influenza-Unspecified 01/05/2011, 11/11/2011, 10/25/2012, 11/03/2016, 11/24/2017, 11/14/2019   Moderna SARS-COV2 Booster Vaccination 01/03/2020   Pneumococcal Conjugate-13 11/29/2018   Pneumococcal Polysaccharide-23 04/28/2016   Tdap 04/28/2015, 05/23/2017   Unspecified SARS-COV-2 Vaccination 03/06/2019, 04/03/2019   Zoster Recombinat (Shingrix) 11/24/2017, 11/29/2018    These are the patient goals that we discussed:  Goals Addressed               This Visit's Progress     Patient Stated (pt-stated)        Recently quit alcohol and he would like to make sure that he doesn't drink like before and is only drinking on special occasions.         This is a list of Health Maintenance Items that are overdue or due now: Influenza vaccine Colorectal cancer screening - patient stated he had one in July 2014.  Orders/Referrals Placed Today: No orders  of the defined types were placed in this encounter.  (Contact our referral department at [phone] if you have not spoken with someone about your referral appointment within the next 5 days)    Follow-up Plan Follow-up with Sunnie Nielsen, DO as planned Schedule your flu vaccine. Medicare wellness in one year. AVS printed and given to the patient.   Health Maintenance, Male Adopting a healthy lifestyle and getting preventive care are important in promoting health and wellness. Ask your health care provider about: The right schedule for you to have regular tests and exams. Things you can do on your own to prevent diseases and keep yourself healthy. What should I know about diet, weight, and exercise? Eat a healthy diet  Eat a diet that includes plenty of vegetables, fruits, low-fat dairy products, and lean protein. Do not eat a lot of foods that are high in solid fats, added sugars, or sodium.  Maintain a healthy weight Body mass index (BMI) is a measurement that can be used to identify possible weight problems. It estimates body fat based on height and weight. Your health care provider can help determine your BMI and help you achieve or maintain ahealthy weight. Get regular exercise Get regular exercise. This is one of the most important things you can do for your health. Most adults should: Exercise for at least 150 minutes each week. The exercise should increase your heart rate and make you sweat (moderate-intensity exercise). Do strengthening exercises at least twice a week. This is in addition to the moderate-intensity exercise. Spend less time sitting. Even light physical activity can be beneficial. Watch cholesterol and  blood lipids Have your blood tested for lipids and cholesterol at 67 years of age, then havethis test every 5 years. You may need to have your cholesterol levels checked more often if: Your lipid or cholesterol levels are high. You are older than 67 years  of age. You are at high risk for heart disease. What should I know about cancer screening? Many types of cancers can be detected early and may often be prevented. Depending on your health history and family history, you may need to have cancer screening at various ages. This may include screening for: Colorectal cancer. Prostate cancer. Skin cancer. Lung cancer. What should I know about heart disease, diabetes, and high blood pressure? Blood pressure and heart disease High blood pressure causes heart disease and increases the risk of stroke. This is more likely to develop in people who have high blood pressure readings, are of African descent, or are overweight. Talk with your health care provider about your target blood pressure readings. Have your blood pressure checked: Every 3-5 years if you are 78-70 years of age. Every year if you are 58 years old or older. If you are between the ages of 42 and 91 and are a current or former smoker, ask your health care provider if you should have a one-time screening for abdominal aortic aneurysm (AAA). Diabetes Have regular diabetes screenings. This checks your fasting blood sugar level. Have the screening done: Once every three years after age 1 if you are at a normal weight and have a low risk for diabetes. More often and at a younger age if you are overweight or have a high risk for diabetes. What should I know about preventing infection? Hepatitis B If you have a higher risk for hepatitis B, you should be screened for this virus. Talk with your health care provider to find out if you are at risk forhepatitis B infection. Hepatitis C Blood testing is recommended for: Everyone born from [birth year] through 1967. Anyone with known risk factors for hepatitis C. Sexually transmitted infections (STIs) You should be screened each year for STIs, including gonorrhea and chlamydia, if: You are sexually active and are younger than 67 years of age. You are  older than 67 years of age and your health care provider tells you that you are at risk for this type of infection. Your sexual activity has changed since you were last screened, and you are at increased risk for chlamydia or gonorrhea. Ask your health care provider if you are at risk. Ask your health care provider about whether you are at high risk for HIV. Your health care provider may recommend a prescription medicine to help prevent HIV infection. If you choose to take medicine to prevent HIV, you should first get tested for HIV. You should then be tested every 3 months for as long as you are taking the medicine. Follow these instructions at home: Lifestyle Do not use any products that contain nicotine or tobacco, such as cigarettes, e-cigarettes, and chewing tobacco. If you need help quitting, ask your health care provider. Do not use street drugs. Do not share needles. Ask your health care provider for help if you need support or information about quitting drugs. Alcohol use Do not drink alcohol if your health care provider tells you not to drink. If you drink alcohol: Limit how much you have to 0-2 drinks a day. Be aware of how much alcohol is in your drink. In the [place] one drink equals one 12 oz  bottle of beer (355 mL), one 5 oz glass of wine (148 mL), or one 1 oz glass of hard liquor (44 mL). General instructions Schedule regular health, dental, and eye exams. Stay current with your vaccines. Tell your health care provider if: You often feel depressed. You have ever been abused or do not feel safe at home. Summary Adopting a healthy lifestyle and getting preventive care are important in promoting health and wellness. Follow your health care provider's instructions about healthy diet, exercising, and getting tested or screened for diseases. Follow your health care provider's instructions on monitoring your cholesterol and blood pressure. This information is not intended to replace  advice given to you by your health care provider. Make sure you discuss any questions you have with your healthcare provider. Document Revised: 01/11/2018 Document Reviewed: 01/11/2018 Elsevier Patient Education  2022 [employer].

## 2020-09-26 NOTE — Telephone Encounter (Signed)
See rx - pt needed for insurance to cover hepatitis panel

## 2020-11-19 ENCOUNTER — Telehealth: Payer: Self-pay | Admitting: Osteopathic Medicine

## 2020-11-19 NOTE — Telephone Encounter (Signed)
Pt called. He had a medicare wellness exam initial on 8/26.  Medicare did not pay for the bill and the bill is $400. Medicare says he has to get his claim resubmitted as 'Welcome to [facility] Preventative visit' before they will pay for the visit.  Thank you.

## 2020-11-27 ENCOUNTER — Other Ambulatory Visit: Payer: Self-pay | Admitting: Osteopathic Medicine

## 2020-12-06 ENCOUNTER — Other Ambulatory Visit: Payer: Self-pay | Admitting: Osteopathic Medicine

## 2020-12-06 DIAGNOSIS — I10 Essential (primary) hypertension: Secondary | ICD-10-CM

## 2020-12-28 ENCOUNTER — Other Ambulatory Visit: Payer: Self-pay | Admitting: Osteopathic Medicine

## 2021-01-26 ENCOUNTER — Other Ambulatory Visit: Payer: Self-pay | Admitting: Family Medicine

## 2021-02-28 ENCOUNTER — Other Ambulatory Visit: Payer: Self-pay | Admitting: Family Medicine

## 2021-03-03 ENCOUNTER — Ambulatory Visit (INDEPENDENT_AMBULATORY_CARE_PROVIDER_SITE_OTHER): Payer: Medicare Other | Admitting: Family Medicine

## 2021-03-03 ENCOUNTER — Other Ambulatory Visit: Payer: Self-pay

## 2021-03-03 ENCOUNTER — Encounter: Payer: Self-pay | Admitting: Family Medicine

## 2021-03-03 DIAGNOSIS — R350 Frequency of micturition: Secondary | ICD-10-CM

## 2021-03-03 DIAGNOSIS — I10 Essential (primary) hypertension: Secondary | ICD-10-CM

## 2021-03-03 DIAGNOSIS — E782 Mixed hyperlipidemia: Secondary | ICD-10-CM | POA: Diagnosis not present

## 2021-03-03 DIAGNOSIS — Z79899 Other long term (current) drug therapy: Secondary | ICD-10-CM

## 2021-03-03 DIAGNOSIS — R3916 Straining to void: Secondary | ICD-10-CM | POA: Diagnosis not present

## 2021-03-03 DIAGNOSIS — M5416 Radiculopathy, lumbar region: Secondary | ICD-10-CM | POA: Diagnosis not present

## 2021-03-03 DIAGNOSIS — R7303 Prediabetes: Secondary | ICD-10-CM

## 2021-03-03 MED ORDER — PRAVASTATIN SODIUM 80 MG PO TABS: 80.0000 mg | ORAL_TABLET | Freq: Every day | ORAL | 3 refills | Status: DC

## 2021-03-03 NOTE — Progress Notes (Signed)
Steven Hebert - 66 y.o. male MRN [id number]  Date of birth: 14-Feb-1955  Subjective Chief Complaint  Patient presents with   Transitions Of Care    HPI Steven Hebert is a 66 year old male here today for follow-up visit.  He has history of left lumbar radiculopathy.  He has been seeing orthopedics and had injections without significant relief.  He is requesting referral to another orthopedic spine specialist to discuss additional options and for second opinion.  Currently taking hydrocodone and Celebrex as needed for management of of pain.  He does also have gabapentin but is not really using this.  He is having some difficulty with urinary stream.  Has to strain more to pass urine.  He has also had some urinary frequency.  Denies pain with urination or hematuria.  Tolerating pravastatin for management of hyperlipidemia and blood pressure remains well controlled with losartan.  ROS:  A comprehensive ROS was completed and negative except as noted per HPI    Allergies  Allergen Reactions   Nsaids Other (See Comments)   Penicillins Rash    Past Medical History:  Diagnosis Date   Alcohol abuse    H/O degenerative disc disease    High blood pressure    High cholesterol     Past Surgical History:  Procedure Laterality Date   ACHILLES TENDON REPAIR     APPENDECTOMY     HERNIA REPAIR     NOSE SURGERY      Social History   Socioeconomic History   Marital status: Married    Spouse name: Magda Paganini   Number of children: 2   Years of education: 14   Highest education level: Some college, no degree  Occupational History   Occupation: Midwife: [employer]    Comment: Part time  Tobacco Use   Smoking status: Former    Packs/day: 0.50    Years: 10.00    Pack years: 5.00    Types: Cigarettes    Quit date: 1977    Years since quitting: 46.1   Smokeless tobacco: Never  Vaping Use   Vaping Use: Never used  Substance and Sexual Activity   Alcohol use: Yes     Alcohol/week: 28.0 standard drinks    Types: 28 Cans of beer per week    Comment: Quit 07/29/20 (had wine 09/20/20)   Drug use: Not Currently    Types: Marijuana   Sexual activity: Yes    Partners: Female, Male  Other Topics Concern   Not on file  Social History Narrative   Lives with his wife. He is working part-time. He enjoys woodworking.   Social Determinants of Health   Financial Resource Strain: Low Risk    Difficulty of Paying Living Expenses: Not hard at all  Food Insecurity: No Food Insecurity   Worried About Charity fundraiser in the Last Year: Never true   [place] in the Last Year: Never true  Transportation Needs: No Transportation Needs   Lack of Transportation (Medical): No   Lack of Transportation (Non-Medical): No  Physical Activity: Sufficiently Active   Days of Exercise per Week: 5 days   Minutes of Exercise per Session: 50 min  Stress: No Stress Concern Present   Feeling of Stress : Not at all  Social Connections: Moderately Integrated   Frequency of Communication with Friends and Family: More than three times a week   Frequency of Social Gatherings with Friends and Family: Once a week  Attends Religious Services: 1 to 4 times per year   Active Member of Clubs or Organizations: No   Attends Archivist Meetings: Never   Marital Status: Married    No family history on file.  Health Maintenance  Topic Date Due   COVID-19 Vaccine (4 - Booster) 01/09/2021   Pneumonia Vaccine 42+ Years old (3 - PPSV23 if available, else PCV20) 04/28/2021   COLONOSCOPY (Pts 45-66yrs Insurance coverage will need to be confirmed)  08/15/2022   TETANUS/TDAP  05/24/2027   INFLUENZA VACCINE  Completed   Hepatitis C Screening  Completed   Zoster Vaccines- Shingrix  Completed   HPV VACCINES  Aged Out      ----------------------------------------------------------------------------------------------------------------------------------------------------------------------------------------------------------------- Physical Exam There were no vitals taken for this visit.  Physical Exam Constitutional:      Appearance: Normal appearance.  HENT:     Head: Normocephalic and atraumatic.  Eyes:     General: No scleral icterus. Cardiovascular:     Rate and Rhythm: Normal rate and regular rhythm.  Pulmonary:     Effort: Pulmonary effort is normal.     Breath sounds: Normal breath sounds.  Musculoskeletal:     Cervical back: Neck supple.  Neurological:     General: No focal deficit present.     Mental Status: He is alert.  Psychiatric:        Mood and Affect: Mood normal.        Behavior: Behavior normal.    ------------------------------------------------------------------------------------------------------------------------------------------------------------------------------------------------------------------- Assessment and Plan  Essential hypertension Blood pressures been well controlled with losartan at current strength.  Recommend continuation at this time.  Updated labs ordered.  Left lumbar radiculopathy Requesting referral to orthopedic spine specialist for second opinion.  Referral entered to [facility] in [place].  Discussed that he can increase his gabapentin to up to 900 mg 3 times daily to help with pain management and reduction and opioid use.  Chronic prescription benzodiazepine use Currently managed with bupropion and alprazolam as needed.  We discussed limiting alprazolam use.  He is only using this occasionally.  Mixed hyperlipidemia Tolerating pravastatin well.  Updating lipid panel.   Meds ordered this encounter  Medications   pravastatin (PRAVACHOL) 80 MG tablet    Sig: Take 1 tablet (80 mg total) by mouth daily.    Dispense:  90 tablet     Refill:  3    Return in about 6 months (around 08/31/2021) for HTN.    This visit occurred during the SARS-CoV-2 public health emergency.  Safety protocols were in place, including screening questions prior to the visit, additional usage of staff PPE, and extensive cleaning of exam room while observing appropriate contact time as indicated for disinfecting solutions.

## 2021-03-03 NOTE — Assessment & Plan Note (Signed)
Blood pressures been well controlled with losartan at current strength.  Recommend continuation at this time.  Updated labs ordered.

## 2021-03-03 NOTE — Assessment & Plan Note (Signed)
Currently managed with bupropion and alprazolam as needed.  We discussed limiting alprazolam use.  He is only using this occasionally.

## 2021-03-03 NOTE — Assessment & Plan Note (Signed)
Requesting referral to orthopedic spine specialist for second opinion.  Referral entered to [facility] in [place].  Discussed that he can increase his gabapentin to up to 900 mg 3 times daily to help with pain management and reduction and opioid use.

## 2021-03-03 NOTE — Assessment & Plan Note (Signed)
Tolerating pravastatin well.  Updating lipid panel. ?

## 2021-03-03 NOTE — Patient Instructions (Signed)
Great to meet you today! You can increase gabapentin to 900mg  three times per day if needed.  We'll be in touch with lab results and recommendations.   See me again in 6 months.

## 2021-03-04 LAB — PSA: PSA: 1.75 ng/mL (ref <=4.00)

## 2021-03-04 LAB — CBC WITH DIFFERENTIAL/PLATELET
Absolute Monocytes: 624 cells/uL (ref 200–950)
Basophils Absolute: 38 cells/uL (ref 0–200)
Basophils Relative: 0.8 %
Eosinophils Absolute: 48 cells/uL (ref 15–500)
Eosinophils Relative: 1 %
HCT: 44.4 % (ref 38.5–50.0)
Hemoglobin: 14.9 g/dL (ref 13.2–17.1)
Lymphs Abs: 974 cells/uL (ref 850–3900)
MCH: 30.9 pg (ref 27.0–33.0)
MCHC: 33.6 g/dL (ref 32.0–36.0)
MCV: 92.1 fL (ref 80.0–100.0)
MPV: 10.6 fL (ref 7.5–12.5)
Monocytes Relative: 13 %
Neutro Abs: 3115 cells/uL (ref 1500–7800)
Neutrophils Relative %: 64.9 %
Platelets: 218 10*3/uL (ref 140–400)
RBC: 4.82 10*6/uL (ref 4.20–5.80)
RDW: 13 % (ref 11.0–15.0)
Total Lymphocyte: 20.3 %
WBC: 4.8 10*3/uL (ref 3.8–10.8)

## 2021-03-04 LAB — URINALYSIS, ROUTINE W REFLEX MICROSCOPIC
Bilirubin Urine: NEGATIVE
Glucose, UA: NEGATIVE
Hgb urine dipstick: NEGATIVE
Ketones, ur: NEGATIVE
Leukocytes,Ua: NEGATIVE
Nitrite: NEGATIVE
Protein, ur: NEGATIVE
Specific Gravity, Urine: 1.019 (ref 1.001–1.035)
pH: 7 (ref 5.0–8.0)

## 2021-03-04 LAB — COMPLETE METABOLIC PANEL WITH GFR
AG Ratio: 1.5 (calc) (ref 1.0–2.5)
ALT: 22 U/L (ref 9–46)
AST: 21 U/L (ref 10–35)
Albumin: 4.2 g/dL (ref 3.6–5.1)
Alkaline phosphatase (APISO): 71 U/L (ref 35–144)
BUN: 17 mg/dL (ref 7–25)
CO2: 24 mmol/L (ref 20–32)
Calcium: 9.6 mg/dL (ref 8.6–10.3)
Chloride: 104 mmol/L (ref 98–110)
Creat: 1.04 mg/dL (ref 0.70–1.35)
Globulin: 2.8 g/dL (calc) (ref 1.9–3.7)
Glucose, Bld: 122 mg/dL — ABNORMAL HIGH (ref 65–99)
Potassium: 4 mmol/L (ref 3.5–5.3)
Sodium: 137 mmol/L (ref 135–146)
Total Bilirubin: 0.6 mg/dL (ref 0.2–1.2)
Total Protein: 7 g/dL (ref 6.1–8.1)
eGFR: 79 mL/min/{1.73_m2} (ref >=60)

## 2021-03-24 ENCOUNTER — Telehealth: Payer: Self-pay

## 2021-03-24 NOTE — Telephone Encounter (Signed)
Lab is medically necessary as he has a history of HLD and prediabetes.

## 2021-03-24 NOTE — Telephone Encounter (Signed)
Pt lvm stating he did not have a lab completed due to a charge. States he would like to discuss with Dr Ashley Royalty. He contacted Medicare and they stated payment would occur if the lab was medically necessary.

## 2021-03-25 NOTE — Telephone Encounter (Signed)
Spoke to pt and explained that the labs are medically necessary. Lab opens 7am.

## 2021-03-27 ENCOUNTER — Other Ambulatory Visit: Payer: Self-pay | Admitting: Sports Medicine

## 2021-03-27 DIAGNOSIS — I10 Essential (primary) hypertension: Secondary | ICD-10-CM

## 2021-05-22 ENCOUNTER — Ambulatory Visit (INDEPENDENT_AMBULATORY_CARE_PROVIDER_SITE_OTHER): Payer: Medicare Other | Admitting: Family Medicine

## 2021-05-22 ENCOUNTER — Encounter: Payer: Self-pay | Admitting: Family Medicine

## 2021-05-22 DIAGNOSIS — I10 Essential (primary) hypertension: Secondary | ICD-10-CM

## 2021-05-22 MED ORDER — ALPRAZOLAM 0.5 MG PO TABS: 0.2500 mg | ORAL_TABLET | Freq: Two times a day (BID) | ORAL | 1 refills | Status: DC | PRN

## 2021-05-22 MED ORDER — LOSARTAN POTASSIUM 100 MG PO TABS: 100.0000 mg | ORAL_TABLET | Freq: Every day | ORAL | 3 refills | Status: DC

## 2021-05-22 NOTE — Progress Notes (Signed)
?Steven Hebert - 66 y.o. male MRN [id number]  Date of birth: 1955-03-03 ? ?Subjective ?No chief complaint on file. ? ? ?HPI ?Steven Hebert is a 66 y.o. male here today with concern about elevated blood pressure.  He was at his orthopedic doctor a couple of weeks ago and blood pressure was 155/111.  Checked again when he got home and got similar reading on home cuff.  He was taking decongestant cold medication around this time as well.  He denies symptoms related to HTN including chest pain, shortness of breath, palpitations, headache or vision changes.   ? ?ROS:  A comprehensive ROS was completed and negative except as noted per HPI ? ?Allergies  ?Allergen Reactions  ? Nsaids Other (See Comments)  ? Penicillins Rash  ? ? ?Past Medical History:  ?Diagnosis Date  ? Alcohol abuse   ? H/O degenerative disc disease   ? High blood pressure   ? High cholesterol   ? ? ?Past Surgical History:  ?Procedure Laterality Date  ? ACHILLES TENDON REPAIR    ? APPENDECTOMY    ? HERNIA REPAIR    ? NOSE SURGERY    ? ? ?Social History  ? ?Socioeconomic History  ? Marital status: Married  ?  Spouse name: Verlon Au  ? Number of children: 2  ? Years of education: 40  ? Highest education level: Some college, no degree  ?Occupational History  ? Occupation: PROJECT SUPERVISOR   ?  Employer: [employer]  ?  Comment: Part time  ?Tobacco Use  ? Smoking status: Former  ?  Packs/day: 0.50  ?  Years: 10.00  ?  Pack years: 5.00  ?  Types: Cigarettes  ?  Quit date: 54  ?  Years since quitting: 46.3  ? Smokeless tobacco: Never  ?Vaping Use  ? Vaping Use: Never used  ?Substance and Sexual Activity  ? Alcohol use: Yes  ?  Alcohol/week: 28.0 standard drinks  ?  Types: 28 Cans of beer per week  ?  Comment: Quit 07/29/20 (had wine 09/20/20)  ? Drug use: Not Currently  ?  Types: Marijuana  ? Sexual activity: Yes  ?  Partners: Female, Male  ?Other Topics Concern  ? Not on file  ?Social History Narrative  ? Lives with his wife. He is working part-time. He  enjoys woodworking.  ? ?Social Determinants of Health  ? ?Financial Resource Strain: Low Risk   ? Difficulty of Paying Living Expenses: Not hard at all  ?Food Insecurity: No Food Insecurity  ? Worried About Programme researcher, broadcasting/film/video in the Last Year: Never true  ? Ran Out of Food in the Last Year: Never true  ?Transportation Needs: No Transportation Needs  ? Lack of Transportation (Medical): No  ? Lack of Transportation (Non-Medical): No  ?Physical Activity: Sufficiently Active  ? Days of Exercise per Week: 5 days  ? Minutes of Exercise per Session: 50 min  ?Stress: No Stress Concern Present  ? Feeling of Stress : Not at all  ?Social Connections: Moderately Integrated  ? Frequency of Communication with Friends and Family: More than three times a week  ? Frequency of Social Gatherings with Friends and Family: Once a week  ? Attends Religious Services: 1 to 4 times per year  ? Active Member of Clubs or Organizations: No  ? Attends Banker Meetings: Never  ? Marital Status: Married  ? ? ?No family history on file. ? ?Health Maintenance  ?Topic Date Due  ?  COVID-19 Vaccine (4 - Booster) 01/09/2021  ? Pneumonia Vaccine 3+ Years old (3) 04/28/2021  ? INFLUENZA VACCINE  09/01/2021  ? COLONOSCOPY (Pts 45-34yrs Insurance coverage will need to be confirmed)  08/15/2022  ? TETANUS/TDAP  05/24/2027  ? Hepatitis C Screening  Completed  ? Zoster Vaccines- Shingrix  Completed  ? HPV VACCINES  Aged Out  ? ? ? ?----------------------------------------------------------------------------------------------------------------------------------------------------------------------------------------------------------------- ?Physical Exam ?BP (!) 141/85 (BP Location: Right Arm, Patient Position: Sitting, Cuff Size: Large)   Pulse 88   Ht 6' (1.829 m)   Wt 218 lb (98.9 kg)   SpO2 95%   BMI 29.57 kg/m?  ? ?Physical Exam ?Constitutional:   ?   Appearance: Normal appearance.  ?Eyes:  ?   General: No scleral  icterus. ?Cardiovascular:  ?   Rate and Rhythm: Normal rate and regular rhythm.  ?Pulmonary:  ?   Effort: Pulmonary effort is normal.  ?   Breath sounds: Normal breath sounds.  ?Musculoskeletal:  ?   Cervical back: Neck supple.  ?Neurological:  ?   Mental Status: He is alert.  ?Psychiatric:     ?   Mood and Affect: Mood normal.     ?   Behavior: Behavior normal.  ? ? ?------------------------------------------------------------------------------------------------------------------------------------------------------------------------------------------------------------------- ?Assessment and Plan ? ?Essential hypertension ?Blood pressure improved today compared to home readings.  May be due to combination of pain/anxiety related to recent back injection and decongestant use.  Advised to avoid decongestants. Mildly elevated today, will have him Continue to monitor BP at home and continue current dose of losartan.   ? ? ?Meds ordered this encounter  ?Medications  ? ALPRAZolam (XANAX) 0.5 MG tablet  ?  Sig: Take 0.5-1 tablets (0.25-0.5 mg total) by mouth 2 (two) times daily as needed for anxiety or sleep.  ?  Dispense:  30 tablet  ?  Refill:  1  ?  This request is for a new prescription for a controlled substance as required by Federal/State law.  ? losartan (COZAAR) 100 MG tablet  ?  Sig: Take 1 tablet (100 mg total) by mouth daily.  ?  Dispense:  90 tablet  ?  Refill:  3  ? ? ?No follow-ups on file. ? ? ? ?This visit occurred during the SARS-CoV-2 public health emergency.  Safety protocols were in place, including screening questions prior to the visit, additional usage of staff PPE, and extensive cleaning of exam room while observing appropriate contact time as indicated for disinfecting solutions.  ? ?

## 2021-05-22 NOTE — Assessment & Plan Note (Signed)
Blood pressure improved today compared to home readings.  May be due to combination of pain/anxiety related to recent back injection and decongestant use.  Advised to avoid decongestants. Mildly elevated today, will have him Continue to monitor BP at home and continue current dose of losartan.   ?

## 2021-07-07 ENCOUNTER — Ambulatory Visit (INDEPENDENT_AMBULATORY_CARE_PROVIDER_SITE_OTHER): Payer: Medicare Other | Admitting: Sports Medicine

## 2021-07-07 ENCOUNTER — Ambulatory Visit (INDEPENDENT_AMBULATORY_CARE_PROVIDER_SITE_OTHER): Payer: Medicare Other

## 2021-07-07 DIAGNOSIS — M20012 Mallet finger of left finger(s): Secondary | ICD-10-CM

## 2021-07-07 DIAGNOSIS — M5416 Radiculopathy, lumbar region: Secondary | ICD-10-CM

## 2021-07-07 DIAGNOSIS — K449 Diaphragmatic hernia without obstruction or gangrene: Secondary | ICD-10-CM

## 2021-07-07 MED ORDER — CELECOXIB 200 MG PO CAPS: ORAL_CAPSULE | ORAL | 2 refills | Status: DC

## 2021-07-07 MED ORDER — PREDNISONE 50 MG PO TABS: ORAL_TABLET | ORAL | 0 refills | Status: DC

## 2021-07-07 MED ORDER — PREGABALIN 200 MG PO CAPS: 200.0000 mg | ORAL_CAPSULE | Freq: Two times a day (BID) | ORAL | 3 refills | Status: DC

## 2021-07-07 NOTE — Assessment & Plan Note (Signed)
Axial back pain, radiating down the left leg, has been working with Dr. Rolena Infante at [facility]. Currently on Lyrica 150 twice a day, increasing to 200 twice a day, adding Celebrex, 5 days of prednisone. He will need to do formal physical therapy. Return to see me in 4 weeks, he did have an MRI with [facility] and I am happy to give it a look.

## 2021-07-07 NOTE — Assessment & Plan Note (Signed)
Unclear etiology, woke up about 2 weeks ago with a mallet finger. Stack splint placed, adding x-rays, he understands to not let the distal interphalangeal joint drop for at least 8 weeks. See me back in 4 weeks for this.

## 2021-07-07 NOTE — Progress Notes (Signed)
    Procedures performed today:    None.  Independent interpretation of notes and tests performed by another provider:   None.  Brief History, Exam, Impression, and Recommendations:    Left lumbar radiculopathy Axial back pain, radiating down the left leg, has been working with Dr. Shon Baton at [facility]. Currently on Lyrica 150 twice a day, increasing to 200 twice a day, adding Celebrex, 5 days of prednisone. He will need to do formal physical therapy. Return to see me in 4 weeks, he did have an MRI with [facility] and I am happy to give it a look.  Mallet deformity of left ring finger Unclear etiology, woke up about 2 weeks ago with a mallet finger. Stack splint placed, adding x-rays, he understands to not let the distal interphalangeal joint drop for at least 8 weeks. See me back in 4 weeks for this.  Hiatal hernia Referral to [facility] for endoscopic Niesen.    ___________________________________________ Ihor Austin. Benjamin Stain, M.D., ABFM., CAQSM. Primary Care and Sports Medicine [facility]  Adjunct Instructor of Family Medicine  [employer]

## 2021-07-07 NOTE — Assessment & Plan Note (Signed)
Referral to [facility] for endoscopic Niesen.

## 2021-07-15 ENCOUNTER — Ambulatory Visit: Payer: Medicare Other | Admitting: Physical Therapy

## 2021-07-29 ENCOUNTER — Other Ambulatory Visit: Payer: Self-pay | Admitting: Medical-Surgical

## 2021-07-29 NOTE — Telephone Encounter (Signed)
Last office visit 05/22/2021  Last filled 11/27/2020

## 2021-07-30 ENCOUNTER — Ambulatory Visit: Payer: Medicare Other | Attending: Sports Medicine | Admitting: Physical Therapy

## 2021-07-30 ENCOUNTER — Other Ambulatory Visit: Payer: Self-pay

## 2021-07-30 ENCOUNTER — Encounter: Payer: Self-pay | Admitting: Physical Therapy

## 2021-07-30 DIAGNOSIS — M5416 Radiculopathy, lumbar region: Secondary | ICD-10-CM | POA: Insufficient documentation

## 2021-07-30 DIAGNOSIS — M6281 Muscle weakness (generalized): Secondary | ICD-10-CM | POA: Diagnosis present

## 2021-07-30 DIAGNOSIS — R252 Cramp and spasm: Secondary | ICD-10-CM | POA: Insufficient documentation

## 2021-07-30 NOTE — Therapy (Signed)
[facility] [place] [address] [place], [place], [address] Phone: [phone]   Fax:  [phone]  Physical Therapy Evaluation  Patient Details  Name: Steven Hebert MRN: [id number] Date of Birth: 1955-04-08 Referring Provider (PT): Monica Becton, MD   Encounter Date: 07/30/2021  Rationale for Evaluation and Treatment Rehabilitation   PT End of Session - 07/30/21 1018     Visit Number 1    Number of Visits 6    Date for PT Re-Evaluation 09/10/21    PT Start Time 1018    PT Stop Time 1105    PT Time Calculation (min) 47 min    Activity Tolerance Patient tolerated treatment well    Behavior During Therapy [facility] for tasks assessed/performed             Past Medical History:  Diagnosis Date   Alcohol abuse    H/O degenerative disc disease    High blood pressure    High cholesterol     Past Surgical History:  Procedure Laterality Date   ACHILLES TENDON REPAIR     APPENDECTOMY     HERNIA REPAIR     NOSE SURGERY      There were no vitals filed for this visit.    Subjective Assessment - 07/30/21 1023     Subjective Last 2 days have been awful. left lateral thigh is the worse. Long h/o of back issues since 10. Been getting injections the last 4-5 years. 8+ spinal epidurals. Last one didn't help at all. Latest episode since the 07/02/21. last night leg was so bad couldn't sleep. Must sleep on side due to breathing. Foot is numb a lot. Cannot sit in bed or on couch. Pain stops at knee. Sitting is when the foot gets bad. Back is sore.    Pertinent History achilles tendon repair 2012 L, hiatal hernia, spinal injections    Limitations Sitting;Standing;Walking    How long can you stand comfortably? 1-2 min    How long can you walk comfortably? better if walking fast    Currently in Pain? Yes    Pain Score 8     Aggravating Factors  standing and walking    Pain Relieving Factors flexion, sitting                 [facility] PT Assessment - 07/30/21 0001       Assessment   Medical Diagnosis M54.16 (ICD-10-CM) - Left lumbar radiculopathy    Referring Provider (PT) Monica Becton, MD    Onset Date/Surgical Date 07/02/21    Next MD Visit 07/06/21    Prior Therapy for back and left achilles      Precautions   Precautions None      Restrictions   Weight Bearing Restrictions No      Balance Screen   Has the patient fallen in the past 6 months No    Has the patient had a decrease in activity level because of a fear of falling?  No    Is the patient reluctant to leave their home because of a fear of falling?  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 residence      Prior Function   Level of Independence Independent    Vocation Retired    Leisure doesn't like to sit still      Observation/Other Assessments   Focus on Therapeutic Outcomes (FOTO)  61 (predicted 67)  Posture/Postural Control   Posture/Postural Control No significant limitations      ROM / Strength   AROM / PROM / Strength AROM;Strength      AROM   Overall AROM Comments Lumbar WNL; Pain with left SB immediately into leg; Relief with R SB      Strength   Overall Strength Comments bil hip flex 4+/5, left ankle inv 4+/5 with core weakness noted during MMT; else grossly 5/5 in bil LE      Flexibility   Soft Tissue Assessment /Muscle Length yes    Hamstrings marked bil    Quadriceps WNL    ITB WNL    Piriformis marked bil    Quadratus Lumborum tight lumbar bil      Palpation   Spinal mobility decreased UPA mobility L2- L5/S1 bil pain at L4/5 bil    Palpation comment increased tissue tension left gluteals and bil lumbar      Special Tests   Other special tests Positive slump and SLR L, negative prone knee flex                        Objective measurements completed on examination: See above findings.                PT Education - 07/30/21 1530     Education  Details HEP, POC discussed    Person(s) Educated Patient    Methods Explanation;Demonstration;Handout    Comprehension Verbalized understanding;Returned demonstration              PT Short Term Goals - 07/30/21 1531       PT SHORT TERM GOAL #1   Title ind with initial HEP    Time 2    Period Weeks    Status New    Target Date 08/13/21               PT Long Term Goals - 07/30/21 1531       PT LONG TERM GOAL #1   Title decreased left radicular sx by >= 75% to improve QOL.    Time 6    Period Weeks    Status New    Target Date 09/10/21      PT LONG TERM GOAL #2   Title increased standing tolerance to >= 30 min to allow meal prep    Time 6    Period Weeks    Status New      PT LONG TERM GOAL #3   Title Patient able to diminish or abolish LE sx with positioning to increase activity tolerance.    Time 6    Period Weeks    Status New      PT LONG TERM GOAL #4   Title improved FOTO  to 67 showing improved function    Baseline 61    Time 6    Period Weeks                    Plan - 07/30/21 1251     Clinical Impression Statement Patient presents with c/o of severe left leg pain beginning around June 1 and progressively worsening. He has a long h/o of low back pain and treatment but no surgeries. He also reports that in April or May he turned "funny" and had significant pain in his left hip and down his leg and he was walking with a limp for a while. The stiffness has resolved. Pain is mainly with  standing and walking, better with flexion at counter and with sitting erect, however he cannot tolerate long sitting in bed or sitting in soft furniture. He reports that with sitting his left foot gets numb. He has functional lumbar ROM and mild strength deficits. He has immediate pain with lumbar SB Left and relief to the Rt. Postive slump and SLR L. Pain limits most ADLs. He will benefit from skilled PT to address these deficits.    Personal Factors and  Comorbidities Comorbidity 1    Comorbidities lumbar DDD    Stability/Clinical Decision Making Stable/Uncomplicated    Clinical Decision Making Low    Rehab Potential Excellent    PT Frequency 1x / week    PT Duration 6 weeks    PT Treatment/Interventions ADLs/Self Care Home Management;Cryotherapy;Electrical Stimulation;Iontophoresis 4mg /ml Dexamethasone;Moist Heat;Traction;Therapeutic activities;Therapeutic exercise;Neuromuscular re-education;Patient/family education;Manual techniques;Dry needling    PT Next Visit Plan Trial of lumbar traction, MT/DN to left gluteals/lumbar, hip flexibility    PT Home Exercise Plan [phone]    Consulted and Agree with Plan of Care Patient             Patient will benefit from skilled therapeutic intervention in order to improve the following deficits and impairments:  Decreased activity tolerance, Decreased strength, Hypomobility, Impaired sensation, Pain, Impaired flexibility, Increased muscle spasms  Visit Diagnosis: Radiculopathy, lumbar region  Muscle weakness (generalized)  Cramp and spasm     Problem List Patient Active Problem List   Diagnosis Date Noted   Mallet deformity of left ring finger 07/07/2021   Anxiety 01/19/2018   Cervical radiculitis 01/19/2018   DDD (degenerative disc disease), cervical 01/19/2018   Esophageal reflux 01/19/2018   Essential hypertension 01/19/2018   Hiatal hernia 01/19/2018   Low back pain 01/19/2018   Meralgia paresthetica of left side 01/19/2018   Mixed hyperlipidemia 01/19/2018   Sleep disorder 01/19/2018   Chronic prescription benzodiazepine use 11/24/2017   Lumbar neuritis 12/07/2013   Diverticulosis of colon 06/08/2013   Left lumbar radiculopathy 05/24/2012   Essential tremor 10/01/2010    10/03/2010, PT 07/30/2021, 3:41 PM  [facility] [place] [address] [place], [place], [place] Phone: [phone]   Fax:  [phone]  Name:  Steven Hebert MRN: [name] Date of Birth: April 05, 1955

## 2021-07-30 NOTE — Patient Instructions (Addendum)
Access Code: [email or web address] URL: [email or web address] Date: 07/30/2021 Prepared by: Raynelle Fanning  Exercises - Supine Piriformis Stretch with Leg Straight  - 2 x daily - 7 x weekly - 1 sets - 3 reps - 30 sec hold - Supine Hamstring Stretch with Strap  - 2 x daily - 7 x weekly - 1 sets - 3 reps - 60 sec hold - Sidelying Thoracic Lumbar Rotation (Mirrored)  - OVER PILLOW FOR POSITIONING AND RELIEF OF SYMPTOMS  2 x daily - 7 x weekly - 2 sets - 10 reps

## 2021-08-01 IMAGING — US US ABDOMINAL AORTA SCREENING AAA
1 series · 12 of 12 positions shown · non-contrast
Comparison: No prior.

CLINICAL DATA: AAA screening.

EXAM:
US ABDOMINAL AORTA MEDICARE SCREENING
TECHNIQUE: Ultrasound examination of the abdominal aorta was performed as a
screening evaluation for abdominal aortic aneurysm.

[Series 1: us abdominal aorta screening aaa · 0.31mm/px · 12 of 12 slices shown]
[im 1/12]
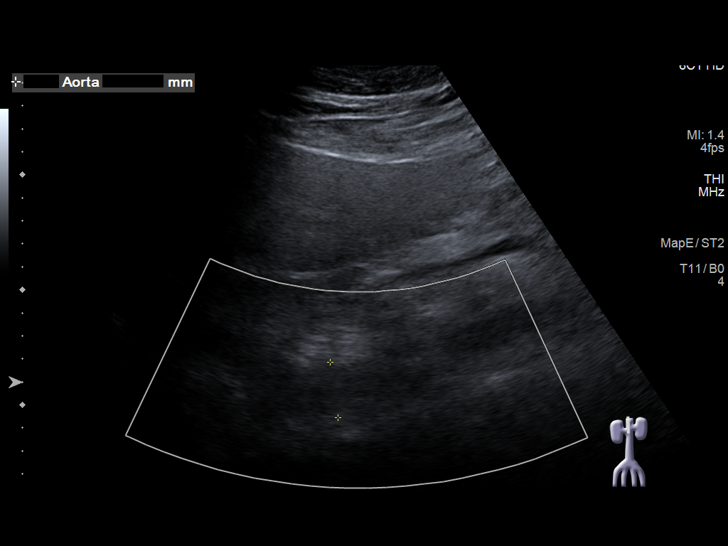
[im 2/12]
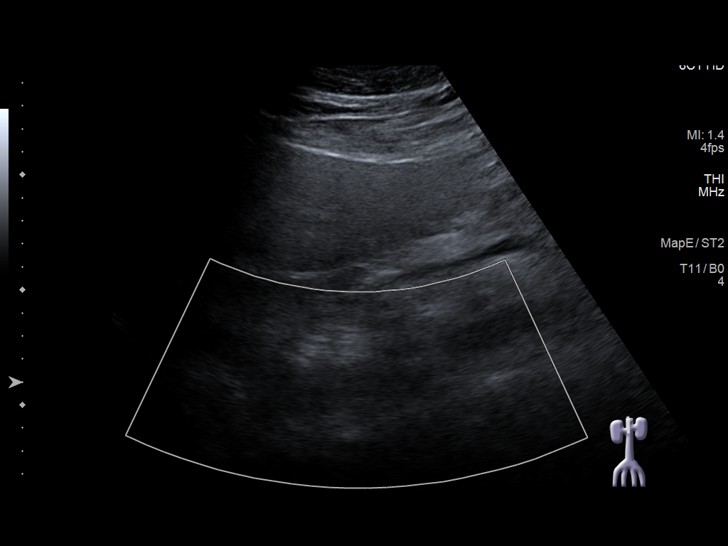
[im 3/12]
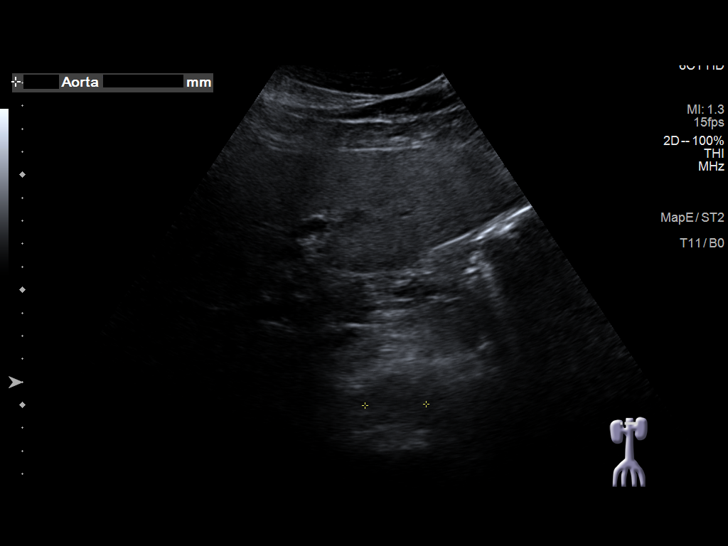
[im 4/12]
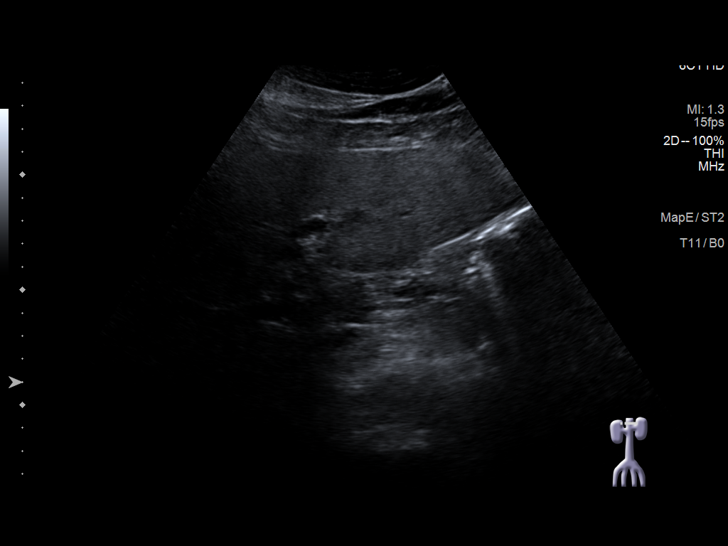
[im 5/12]
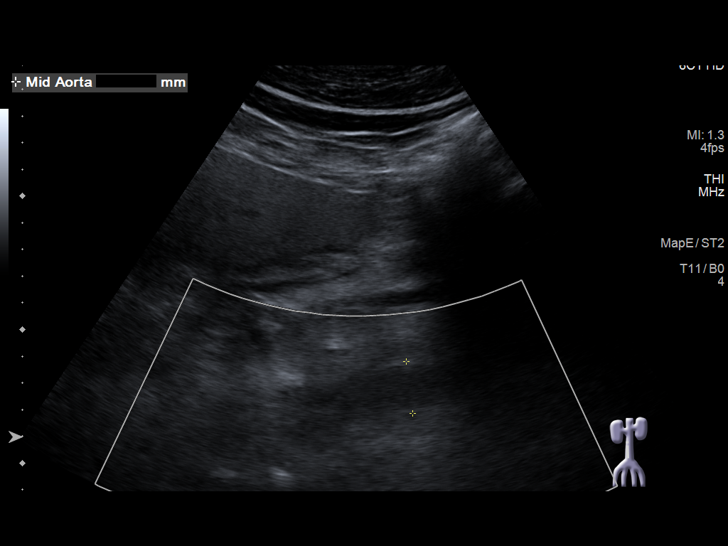
[im 6/12]
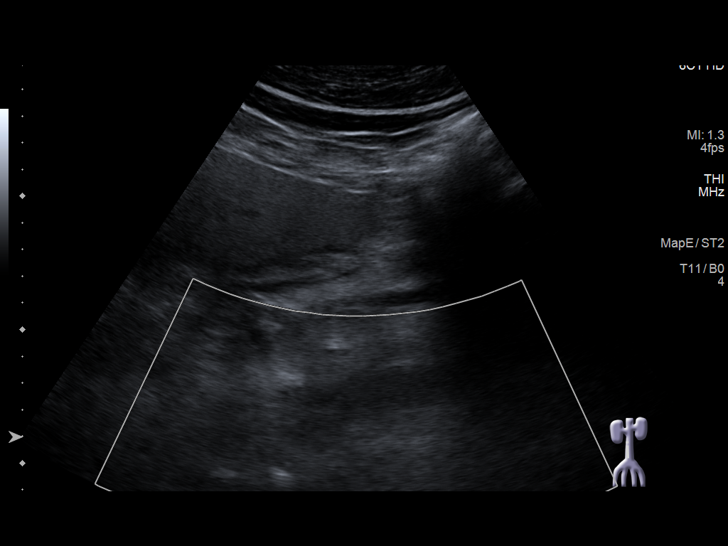
[im 7/12]
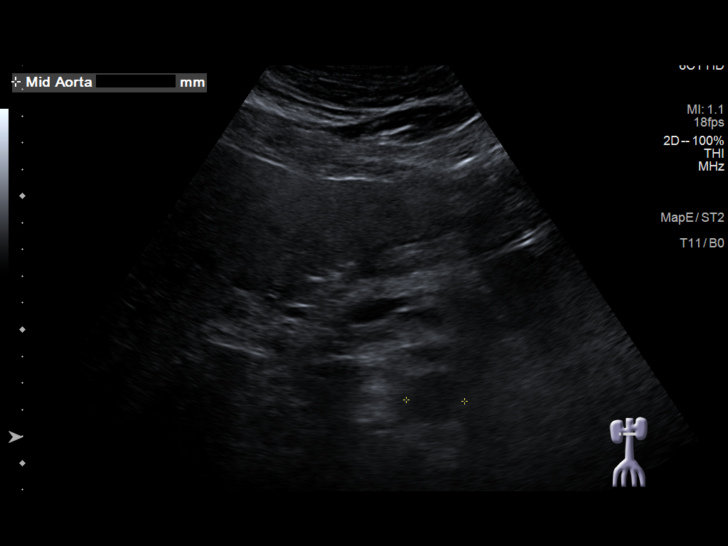
[im 8/12]
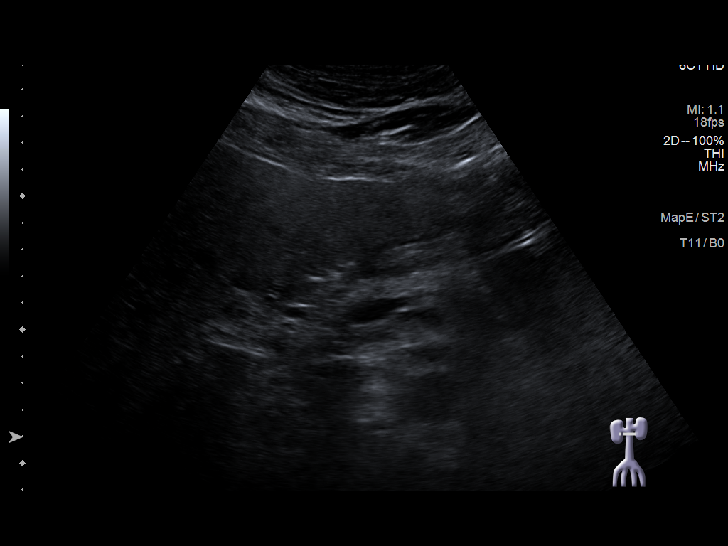
[im 9/12]
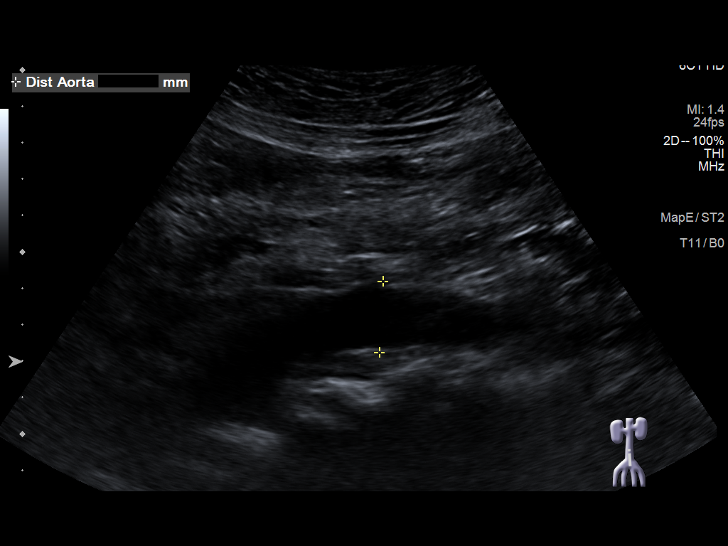
[im 10/12]
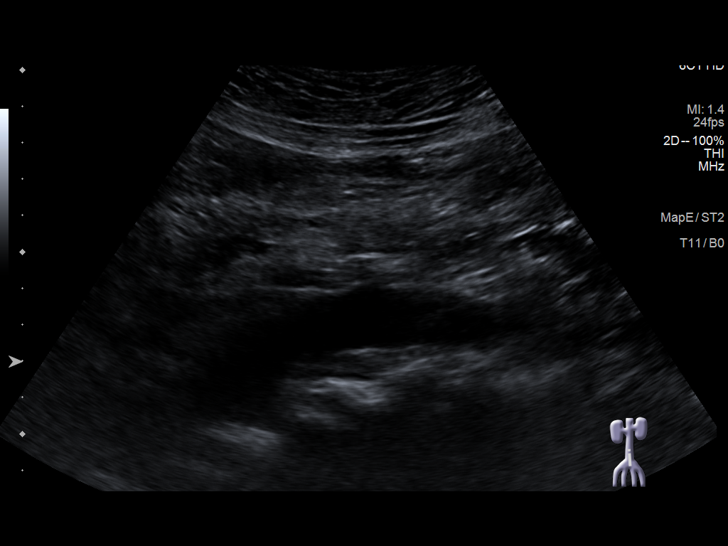
[im 11/12]
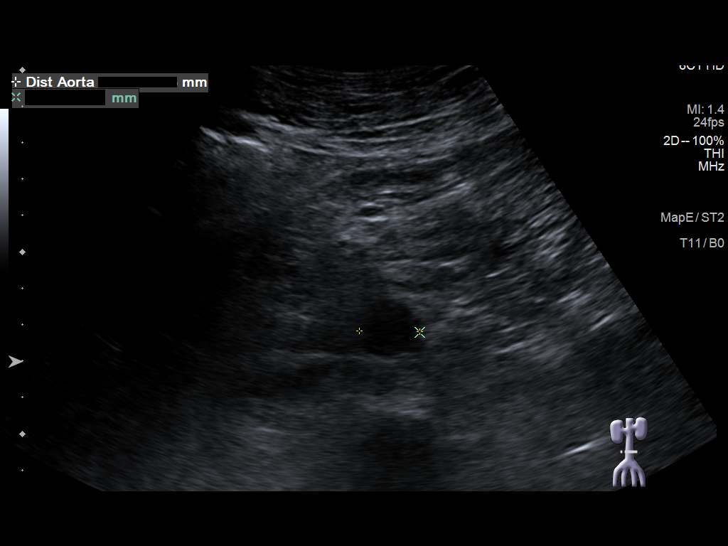
[im 12/12]
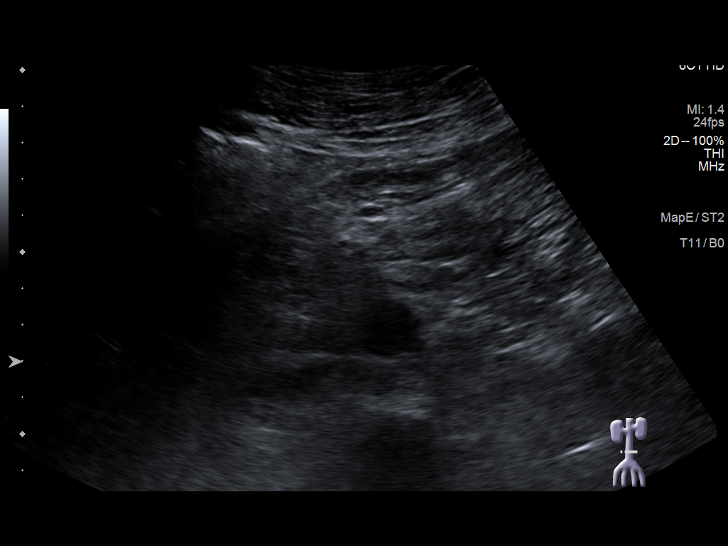

[12 of 12 positions shown; findings below may reference images not displayed]

FINDINGS: Abdominal aortic measurements as follows:

Proximal:  2.7 cm

Mid:  2.2 cm

Distal:  2.0 cm

Exam slightly limited due to overlying bowel gas.
IMPRESSION: No evidence of abdominal aortic aneurysm. Ectatic abdominal aorta at
risk for aneurysm development. Recommend follow up by US in 5 years.
This recommendation follows ACR consensus guidelines: White Paper of
the ACR Incidental Findings Committee II on Vascular Findings. [HOSPITAL] 0251; [DATE].

## 2021-08-05 ENCOUNTER — Ambulatory Visit: Payer: Medicare Other | Attending: Sports Medicine | Admitting: Rehabilitative and Restorative Service Providers"

## 2021-08-05 ENCOUNTER — Ambulatory Visit (INDEPENDENT_AMBULATORY_CARE_PROVIDER_SITE_OTHER): Payer: Medicare Other | Admitting: Sports Medicine

## 2021-08-05 DIAGNOSIS — R252 Cramp and spasm: Secondary | ICD-10-CM | POA: Insufficient documentation

## 2021-08-05 DIAGNOSIS — M6281 Muscle weakness (generalized): Secondary | ICD-10-CM | POA: Diagnosis present

## 2021-08-05 DIAGNOSIS — M20012 Mallet finger of left finger(s): Secondary | ICD-10-CM

## 2021-08-05 DIAGNOSIS — M47816 Spondylosis without myelopathy or radiculopathy, lumbar region: Secondary | ICD-10-CM

## 2021-08-05 DIAGNOSIS — M5416 Radiculopathy, lumbar region: Secondary | ICD-10-CM

## 2021-08-05 MED ORDER — CELECOXIB 200 MG PO CAPS: ORAL_CAPSULE | ORAL | 2 refills | Status: DC

## 2021-08-05 MED ORDER — PREGABALIN 200 MG PO CAPS: 200.0000 mg | ORAL_CAPSULE | Freq: Three times a day (TID) | ORAL | 3 refills | Status: DC

## 2021-08-05 NOTE — Progress Notes (Signed)
    Procedures performed today:    None.  Independent interpretation of notes and tests performed by another provider:   Lumbar spine MRI from a year ago personally reviewed, there is multilevel lumbar DDD, degenerative changes and spinal stenosis are mild to moderate at worst at the L2-L3 level, there is also multilevel lower lumbar facet arthritis.  Disc returned to patient today.  Brief History, Exam, Impression, and Recommendations:    Lumbar spondylosis Steven Hebert returns, continues to have axial low back pain with radiation, numbness and tingling down the left leg, has been working with Dr. Shon Baton, currently on Lyrica 200 twice daily, Celebrex, he has been doing Celebrex 3 times a day, caution to keep it down to twice daily. Prednisone was tremendously effective as his physical therapy though he has only had a couple of sessions. I did review his MRI today. He does have DDD, spinal stenosis worst at L2-L3 and multilevel facet arthritis in the lower lumbar spine. He has had about 8 epidurals with [facility], if insufficient improvement we can certainly try facet injections as his pain is worse with spinal extension. Increasing Lyrica to 200 mg 3 times daily, and refilling Celebrex. Return to see me in 4 weeks.  Mallet deformity of left ring finger Soft tissue mallet based on x-rays, able to keep the finger straight now after 4 weeks in a stack splint. He needs to do 4 more weeks followed by home physical therapy to regain motion at the DIP.Marland Kitchen  Chronic process with exacerbation and pharmacologic intervention  ____________________________________________ Ihor Austin. Benjamin Stain, M.D., ABFM., CAQSM., AME. Primary Care and Sports Medicine  [facility]  Adjunct Professor of Family Medicine  [place] [employer]  Restaurant manager, fast food

## 2021-08-05 NOTE — Assessment & Plan Note (Signed)
Steven Hebert returns, continues to have axial low back pain with radiation, numbness and tingling down the left leg, has been working with Dr. Shon Baton, currently on Lyrica 200 twice daily, Celebrex, he has been doing Celebrex 3 times a day, caution to keep it down to twice daily. Prednisone was tremendously effective as his physical therapy though he has only had a couple of sessions. I did review his MRI today. He does have DDD, spinal stenosis worst at L2-L3 and multilevel facet arthritis in the lower lumbar spine. He has had about 8 epidurals with [facility], if insufficient improvement we can certainly try facet injections as his pain is worse with spinal extension. Increasing Lyrica to 200 mg 3 times daily, and refilling Celebrex. Return to see me in 4 weeks.

## 2021-08-05 NOTE — Assessment & Plan Note (Signed)
Soft tissue mallet based on x-rays, able to keep the finger straight now after 4 weeks in a stack splint. He needs to do 4 more weeks followed by home physical therapy to regain motion at the DIP.Marland Kitchen

## 2021-08-05 NOTE — Therapy (Signed)
[facility] [place] [address] [place], [place], [address] Phone: [phone]   Fax:  [phone]  Physical Therapy Treatment  Patient Details  Name: Steven Hebert MRN: [id number] Date of Birth: Apr 11, 1955 Referring Provider (PT): Monica Becton, MD   Encounter Date: 08/05/2021   PT End of Session - 08/05/21 0852     Visit Number 2    Number of Visits 6    Date for PT Re-Evaluation 09/10/21    PT Start Time 0847    PT Stop Time 0928    PT Time Calculation (min) 41 min    Activity Tolerance Patient tolerated treatment well    Behavior During Therapy [facility] for tasks assessed/performed             Past Medical History:  Diagnosis Date   Alcohol abuse    H/O degenerative disc disease    High blood pressure    High cholesterol     Past Surgical History:  Procedure Laterality Date   ACHILLES TENDON REPAIR     APPENDECTOMY     HERNIA REPAIR     NOSE SURGERY      There were no vitals filed for this visit.   Subjective Assessment - 08/05/21 0851     Subjective The patient reports his pain is excruciating and has been a 9/10 this morning. He notes stretches help, but pain returns within minutes of standing.    Pertinent History achilles tendon repair 2012 L, hiatal hernia, spinal injections    Patient Stated Goals Reduce pain.    Currently in Pain? Yes    Pain Score 9     Pain Location Leg    Pain Orientation Upper;Lateral    Pain Descriptors / Indicators Aching;Sore;Discomfort    Pain Type Acute pain    Pain Onset More than a month ago    Pain Frequency Intermittent    Aggravating Factors  standing and walking    Pain Relieving Factors stretches, flexion, sitting                [facility] PT Assessment - 08/05/21 0854       Assessment   Medical Diagnosis M54.16 (ICD-10-CM) - Left lumbar radiculopathy    Referring Provider (PT) Monica Becton, MD    Onset Date/Surgical Date 07/02/21                            [facility] Adult PT Treatment/Exercise - 08/05/21 0854       Exercises   Exercises Lumbar;Knee/Hip      Lumbar Exercises: Stretches   Active Hamstring Stretch Right;Left;1 rep;30 seconds    Active Hamstring Stretch Limitations modified to sitting due to patient demonstratin standing way he does at home b/c he doesn't have strap-- discussed technique    Passive Hamstring Stretch Right;Left;2 reps;30 seconds    Passive Hamstring Stretch Limitations adding groin stretch supine    Lower Trunk Rotation 5 reps;10 seconds    Hip Flexor Stretch Right;Left    Prone on Elbows Stretch 2 reps;30 seconds    Press Ups 10 reps    Piriformis Stretch Right;Left;1 rep;30 seconds      Lumbar Exercises: Aerobic   Nustep L5 x 5 minutes LEs only      Lumbar Exercises: Standing   Other Standing Lumbar Exercises single limb stance x 10 seconds R and L; progressed to leg swings with single leg activation -- this leads to  radiating L hip pain (stopped eercise)      Lumbar Exercises: Quadruped   Other Quadruped Lumbar Exercises primal push up x 5 reps x 3 second holds      Knee/Hip Exercises: Standing   Abduction Limitations sidestepping 10 feet R and L, no pain      Manual Therapy   Manual Therapy Soft tissue mobilization    Soft tissue mobilization prone STM L gluts and L lateral HS musculature, ball roll against wall for self glut release.                     PT Education - 08/05/21 0931     Education Details HEP    Person(s) Educated Patient    Methods Explanation;Demonstration;Handout    Comprehension Verbalized understanding;Returned demonstration              PT Short Term Goals - 07/30/21 1531       PT SHORT TERM GOAL #1   Title ind with initial HEP    Time 2    Period Weeks    Status New    Target Date 08/13/21               PT Long Term Goals - 07/30/21 1531       PT LONG TERM GOAL #1   Title decreased left radicular sx  by >= 75% to improve QOL.    Time 6    Period Weeks    Status New    Target Date 09/10/21      PT LONG TERM GOAL #2   Title increased standing tolerance to >= 30 min to allow meal prep    Time 6    Period Weeks    Status New      PT LONG TERM GOAL #3   Title Patient able to diminish or abolish LE sx with positioning to increase activity tolerance.    Time 6    Period Weeks    Status New      PT LONG TERM GOAL #4   Title improved FOTO  to 67 showing improved function    Baseline 61    Time 6    Period Weeks                   Plan - 08/05/21 1328     Clinical Impression Statement The patient tolerated modifications to HEP well today.  He is point tender in L gluts and L lateral HS and may benefit from DN.  He could tolerate stabilization with single leg stance, but no dynamic activities in standing due to worsenign symptoms.  PT to continue to progress to patient tolerance    Personal Factors and Comorbidities Comorbidity 1    Comorbidities lumbar DDD    Stability/Clinical Decision Making Stable/Uncomplicated    Rehab Potential Excellent    PT Frequency 1x / week    PT Duration 6 weeks    PT Treatment/Interventions ADLs/Self Care Home Management;Cryotherapy;Electrical Stimulation;Iontophoresis 4mg /ml Dexamethasone;Moist Heat;Traction;Therapeutic activities;Therapeutic exercise;Neuromuscular re-education;Patient/family education;Manual techniques;Dry needling    PT Next Visit Plan Trial of lumbar traction, MT/DN to left gluteals/lumbar, hip flexibility, *Begin with dry needling next session*  Work on mobs if indicated and then move to traction if no relief    PT Home Exercise Plan [phone]    Consulted and Agree with Plan of Care Patient             Patient will benefit from skilled therapeutic intervention  in order to improve the following deficits and impairments:  Decreased activity tolerance, Decreased strength, Hypomobility, Impaired sensation, Pain,  Impaired flexibility, Increased muscle spasms  Visit Diagnosis: Radiculopathy, lumbar region  Muscle weakness (generalized)  Cramp and spasm     Problem List Patient Active Problem List   Diagnosis Date Noted   Mallet deformity of left ring finger 07/07/2021   Anxiety 01/19/2018   Cervical radiculitis 01/19/2018   DDD (degenerative disc disease), cervical 01/19/2018   Esophageal reflux 01/19/2018   Essential hypertension 01/19/2018   Hiatal hernia 01/19/2018   Low back pain 01/19/2018   Meralgia paresthetica of left side 01/19/2018   Mixed hyperlipidemia 01/19/2018   Sleep disorder 01/19/2018   Chronic prescription benzodiazepine use 11/24/2017   Lumbar neuritis 12/07/2013   Diverticulosis of colon 06/08/2013   Lumbar spondylosis 05/24/2012   Essential tremor 10/01/2010    Jerric Oyen, PT 08/05/2021, 1:35 PM  [facility] [address] [place], [place], [address] Phone: [phone]   Fax:  [phone]  Name: GUILLAUME WENINGER MRN: [id number] Date of Birth: 11-Nov-1955

## 2021-08-05 NOTE — Patient Instructions (Addendum)
Access Code: [email or web address] URL: [email or web address] Date: 08/05/2021 Prepared by: Margretta Ditty  Exercises - Supine Piriformis Stretch with Leg Straight  - 2 x daily - 7 x weekly - 1 sets - 3 reps - 30 sec hold - Sidelying Thoracic Lumbar Rotation (Mirrored)  - 2 x daily - 7 x weekly - 2 sets - 10 reps - Prone SIJ Anterior Rotation Mobilization on Table  - 2 x daily - 7 x weekly - 1 sets - 3 reps - 30 seconds hold - Seated Hamstring Stretch  - 2 x daily - 7 x weekly - 1 sets - 3 reps - 30 second hold - Seated Piriformis Stretch with Trunk Bend  - 2 x daily - 7 x weekly - 1 sets - 2-3 reps - 30 seconds hold - Single Leg Stance  - 2 x daily - 7 x weekly - 1 sets - 3 reps - 10 seconds hold - Standing Piriformis Release with Ball at Wall  - 1-2 x daily - 7 x weekly - 1 sets - 1 reps - 2-3 minutes hold

## 2021-08-11 ENCOUNTER — Ambulatory Visit: Payer: Medicare Other | Admitting: Physical Therapy

## 2021-08-11 DIAGNOSIS — R252 Cramp and spasm: Secondary | ICD-10-CM

## 2021-08-11 DIAGNOSIS — M6281 Muscle weakness (generalized): Secondary | ICD-10-CM

## 2021-08-11 DIAGNOSIS — M5416 Radiculopathy, lumbar region: Secondary | ICD-10-CM

## 2021-08-11 NOTE — Therapy (Signed)
[facility] Outpatient Rehabilitation [place] [address] [place] [address] [place], [place], [address] Phone: [phone]   Fax:  [phone]  Physical Therapy Treatment  Patient Details  Name: Steven Hebert MRN: [id number] Date of Birth: 08/20/1955 Referring Provider (PT): Monica Becton, MD   Encounter Date: 08/11/2021  Rationale for Evaluation and Treatment Rehabilitation    PT End of Session - 08/11/21 6761     Visit Number 3    Number of Visits 6    Date for PT Re-Evaluation 09/10/21    PT Start Time 0925    PT Stop Time 1014    PT Time Calculation (min) 49 min    Activity Tolerance Patient tolerated treatment well    Behavior During Therapy [facility] for tasks assessed/performed             Past Medical History:  Diagnosis Date   Alcohol abuse    H/O degenerative disc disease    High blood pressure    High cholesterol     Past Surgical History:  Procedure Laterality Date   ACHILLES TENDON REPAIR     APPENDECTOMY     HERNIA REPAIR     NOSE SURGERY      There were no vitals filed for this visit.   Subjective Assessment - 08/11/21 0930     Subjective Patient reports he has not had any pain for the past 4 days. Still having numbness with standing in left foot. Left thigh has been numb for 30 years or more.    Patient Stated Goals Reduce pain.    Currently in Pain? No/denies                               [facility] Adult PT Treatment/Exercise - 08/11/21 0001       Self-Care   Self-Care Other Self-Care Comments    Other Self-Care Comments  MFR to lumbar with ball      Lumbar Exercises: Stretches   Hip Flexor Stretch Right;Left;3 reps;30 seconds    Piriformis Stretch --    Figure 4 Stretch 3 reps;30 seconds;Seated;With overpressure    Other Lumbar Stretch Exercise left groin stretch in 1/2 kneel position x 30 sec      Lumbar Exercises: Aerobic   Nustep L5 x 6 minutes LEs only      Lumbar Exercises: Standing    Functional Squats 5 reps    Functional Squats Limitations no wt; good form    Other Standing Lumbar Exercises SLS x 30 sec ea.; vectors 3 spots x 5 ea      Manual Therapy   Manual Therapy Myofascial release;Joint mobilization;Manual Traction    Joint Mobilization UPA mobs bil to lumbar gd III    Myofascial Release Bil QL release    Manual Traction left long leg distraction 2x30 sec                       PT Short Term Goals - 08/11/21 1138       PT SHORT TERM GOAL #1   Title ind with initial HEP    Status Achieved               PT Long Term Goals - 07/30/21 1531       PT LONG TERM GOAL #1   Title decreased left radicular sx by >= 75% to improve QOL.    Time 6    Period Weeks  Status New    Target Date 09/10/21      PT LONG TERM GOAL #2   Title increased standing tolerance to >= 30 min to allow meal prep    Time 6    Period Weeks    Status New      PT LONG TERM GOAL #3   Title Patient able to diminish or abolish LE sx with positioning to increase activity tolerance.    Time 6    Period Weeks    Status New      PT LONG TERM GOAL #4   Title improved FOTO  to 67 showing improved function    Baseline 61    Time 6    Period Weeks                   Plan - 08/11/21 1135     Clinical Impression Statement [name] presents with no pain x 4 days. He feels the stretches are really helping. He continues to have L foot and thigh numbness. He was able to tolerate SLS activities today although vectors were very challenging. Still very tight in bil QLs, but released well with MFR. Pt reluctant to try DN, but I feel this would help his back and left hip.    PT Frequency 1x / week    PT Duration 6 weeks    PT Treatment/Interventions ADLs/Self Care Home Management;Cryotherapy;Electrical Stimulation;Iontophoresis 4mg /ml Dexamethasone;Moist Heat;Traction;Therapeutic activities;Therapeutic exercise;Neuromuscular re-education;Patient/family education;Manual  techniques;Dry needling    PT Next Visit Plan MT/DN to left gluteals/lumbar, hip flexibility,Work on SI mobs if indicated and then move to traction if no relief    PT Home Exercise Plan [id number]    Consulted and Agree with Plan of Care Patient             Patient will benefit from skilled therapeutic intervention in order to improve the following deficits and impairments:  Decreased activity tolerance, Decreased strength, Hypomobility, Impaired sensation, Pain, Impaired flexibility, Increased muscle spasms  Visit Diagnosis: Radiculopathy, lumbar region  Muscle weakness (generalized)  Cramp and spasm     Problem List Patient Active Problem List   Diagnosis Date Noted   Mallet deformity of left ring finger 07/07/2021   Anxiety 01/19/2018   Cervical radiculitis 01/19/2018   DDD (degenerative disc disease), cervical 01/19/2018   Esophageal reflux 01/19/2018   Essential hypertension 01/19/2018   Hiatal hernia 01/19/2018   Low back pain 01/19/2018   Meralgia paresthetica of left side 01/19/2018   Mixed hyperlipidemia 01/19/2018   Sleep disorder 01/19/2018   Chronic prescription benzodiazepine use 11/24/2017   Lumbar neuritis 12/07/2013   Diverticulosis of colon 06/08/2013   Lumbar spondylosis 05/24/2012   Essential tremor 10/01/2010    10/03/2010, PT 08/11/21 11:40 AM   [facility] Outpatient Rehabilitation [place] [address] [place] [address] [place], [place], [place] Phone: [phone]   Fax:  [phone]  Name: GOTHAM RADEN MRN: [name] Date of Birth: [birth year]

## 2021-08-18 ENCOUNTER — Ambulatory Visit: Payer: Medicare Other | Admitting: Physical Therapy

## 2021-08-18 DIAGNOSIS — M5416 Radiculopathy, lumbar region: Secondary | ICD-10-CM

## 2021-08-18 DIAGNOSIS — R252 Cramp and spasm: Secondary | ICD-10-CM

## 2021-08-18 DIAGNOSIS — M6281 Muscle weakness (generalized): Secondary | ICD-10-CM

## 2021-08-18 NOTE — Therapy (Signed)
[facility] Outpatient Rehabilitation Steven Hebert [address] [place] [address] [place], [place], [address] Phone: [phone]   Fax:  [phone]  Physical Therapy Treatment  Patient Details  Name: Steven Hebert MRN: [id number] Date of Birth: [birth year] Referring Provider (PT): Monica Becton, MD  Rationale for Evaluation and Treatment Rehabilitation  Encounter Date: 08/18/2021   PT End of Session - 08/18/21 1015     Visit Number 4    Number of Visits 6    Date for PT Re-Evaluation 09/10/21    PT Start Time 1015    PT Stop Time 1055    PT Time Calculation (min) 40 min    Activity Tolerance Patient tolerated treatment well    Behavior During Therapy [facility] for tasks assessed/performed             Past Medical History:  Diagnosis Date   Alcohol abuse    H/O degenerative disc disease    High blood pressure    High cholesterol     Past Surgical History:  Procedure Laterality Date   ACHILLES TENDON REPAIR     APPENDECTOMY     HERNIA REPAIR     NOSE SURGERY      There were no vitals filed for this visit.   Subjective Assessment - 08/18/21 1018     Subjective Pt states just soreness in his hip. Pt reports he went on elliptical last Friday -- did not notice a difference. Noted that when he plants his left foot and turns left on his hip he can feel it going down his leg. Pt states he would like to try traction.    Pertinent History achilles tendon repair 2012 L, hiatal hernia, spinal injections    Limitations Sitting;Standing;Walking    How long can you stand comfortably? 1-2 min    How long can you walk comfortably? better if walking fast    Patient Stated Goals Reduce pain.    Currently in Pain? Yes    Pain Score 3     Pain Location Hip    Pain Orientation Left                [facility] PT Assessment - 08/18/21 0001       Assessment   Medical Diagnosis M54.16 (ICD-10-CM) - Left lumbar radiculopathy    Referring Provider (PT) Monica Becton, MD    Onset Date/Surgical Date 07/02/21                           [facility] Adult PT Treatment/Exercise - 08/18/21 0001       Lumbar Exercises: Stretches   Piriformis Stretch Right;Left;30 seconds;2 reps    Figure 4 Stretch 30 seconds;Seated;With overpressure    Other Lumbar Stretch Exercise Child's pose x30 sec, with lateral flexion x30 sec L & R      Lumbar Exercises: Aerobic   Nustep L5 x 5 min      Lumbar Exercises: Supine   Bridge Compliant;20 reps      Modalities   Modalities Traction      Traction   Type of Traction Lumbar    Min (lbs) 75    Max (lbs) 100    Hold Time 60 sec    Rest Time 60 sec    Time 10 min      Manual Therapy   Soft tissue mobilization prone STM bilat QL  PT Short Term Goals - 08/11/21 1138       PT SHORT TERM GOAL #1   Title ind with initial HEP    Status Achieved               PT Long Term Goals - 07/30/21 1531       PT LONG TERM GOAL #1   Title decreased left radicular sx by >= 75% to improve QOL.    Time 6    Period Weeks    Status New    Target Date 09/10/21      PT LONG TERM GOAL #2   Title increased standing tolerance to >= 30 min to allow meal prep    Time 6    Period Weeks    Status New      PT LONG TERM GOAL #3   Title Patient able to diminish or abolish LE sx with positioning to increase activity tolerance.    Time 6    Period Weeks    Status New      PT LONG TERM GOAL #4   Title improved FOTO  to 67 showing improved function    Baseline 61    Time 6    Period Weeks                   Plan - 08/18/21 1034     Clinical Impression Statement Steven Hebert states he is feeling more soreness. Increased symptoms with pivoting on planted L foot. Trial of traction performed today per pt request.    PT Frequency 1x / week    PT Duration 6 weeks    PT Treatment/Interventions ADLs/Self Care Home Management;Cryotherapy;Electrical Stimulation;Iontophoresis  4mg /ml Dexamethasone;Moist Heat;Traction;Therapeutic activities;Therapeutic exercise;Neuromuscular re-education;Patient/family education;Manual techniques;Dry needling    PT Next Visit Plan MT/DN to left gluteals/lumbar, hip flexibility,Work on SI mobs if indicated and then move to traction if no relief    PT Home Exercise Plan [id number]    Consulted and Agree with Plan of Care Patient             Patient will benefit from skilled therapeutic intervention in order to improve the following deficits and impairments:  Decreased activity tolerance, Decreased strength, Hypomobility, Impaired sensation, Pain, Impaired flexibility, Increased muscle spasms  Visit Diagnosis: Radiculopathy, lumbar region  Muscle weakness (generalized)  Cramp and spasm     Problem List Patient Active Problem List   Diagnosis Date Noted   Mallet deformity of left ring finger 07/07/2021   Anxiety 01/19/2018   Cervical radiculitis 01/19/2018   DDD (degenerative disc disease), cervical 01/19/2018   Esophageal reflux 01/19/2018   Essential hypertension 01/19/2018   Hiatal hernia 01/19/2018   Low back pain 01/19/2018   Meralgia paresthetica of left side 01/19/2018   Mixed hyperlipidemia 01/19/2018   Sleep disorder 01/19/2018   Chronic prescription benzodiazepine use 11/24/2017   Lumbar neuritis 12/07/2013   Diverticulosis of colon 06/08/2013   Lumbar spondylosis 05/24/2012   Essential tremor 10/01/2010    [facility] [address] [place], PT, DPT 08/18/2021, 11:05 AM  [facility] [address] [place] [address] [place], [place], [place] Phone: [phone]   Fax:  [phone]  Name: Steven Hebert MRN: [name] Date of Birth: [birth year]

## 2021-08-25 ENCOUNTER — Ambulatory Visit: Payer: Medicare Other | Admitting: Physical Therapy

## 2021-08-25 ENCOUNTER — Encounter: Payer: Self-pay | Admitting: Physical Therapy

## 2021-08-25 DIAGNOSIS — M5416 Radiculopathy, lumbar region: Secondary | ICD-10-CM

## 2021-08-25 DIAGNOSIS — R252 Cramp and spasm: Secondary | ICD-10-CM

## 2021-08-25 DIAGNOSIS — M6281 Muscle weakness (generalized): Secondary | ICD-10-CM

## 2021-08-25 NOTE — Patient Instructions (Signed)
Access Code: [email or web address] URL: [email or web address] Date: 08/25/2021 Prepared by: Raynelle Fanning  Exercises - Supine Piriformis Stretch with Leg Straight  - 2 x daily - 7 x weekly - 1 sets - 3 reps - 30 sec hold - Sidelying Thoracic Lumbar Rotation (Mirrored)  - 2 x daily - 7 x weekly - 2 sets - 10 reps - Prone SIJ Anterior Rotation Mobilization on Table  - 2 x daily - 7 x weekly - 1 sets - 3 reps - 30 seconds hold - Seated Hamstring Stretch  - 2 x daily - 7 x weekly - 1 sets - 3 reps - 30 second hold - Seated Piriformis Stretch with Trunk Bend  - 2 x daily - 7 x weekly - 1 sets - 2-3 reps - 30 seconds hold - Single Leg Stance  - 2 x daily - 7 x weekly - 1 sets - 3 reps - 10 seconds hold - Standing Piriformis Release with Ball at [employer]  - 1-2 x daily - 7 x weekly - 1 sets - 1 reps - 2-3 minutes hold - Seated Sciatic Tensioner  - 2 x daily - 7 x weekly - 1 sets - 10 reps - 5 second hold - Standing Lumbar Extension  - 1 x daily - 7 x weekly - 3 sets - 10 reps

## 2021-08-25 NOTE — Therapy (Signed)
[place] [facility] [place], [place], [address] Phone: [phone]   Fax:  [phone]  Physical Therapy Treatment  Patient Details  Name: Steven Hebert MRN: [id number] Date of Birth: 06-17-1955 Referring Provider (PT): Silverio Decamp, MD   Encounter Date: 08/25/2021  Rationale for Evaluation and Treatment Rehabilitation    PT End of Session - 08/25/21 1010     Visit Number 5    Number of Visits 6    Date for PT Re-Evaluation 09/10/21    PT Start Time 1164    PT Stop Time 1101    PT Time Calculation (min) 46 min    Activity Tolerance Patient tolerated treatment well    Behavior During Therapy [facility] for tasks assessed/performed             Past Medical History:  Diagnosis Date   Alcohol abuse    H/O degenerative disc disease    High blood pressure    High cholesterol     Past Surgical History:  Procedure Laterality Date   ACHILLES TENDON REPAIR     APPENDECTOMY     HERNIA REPAIR     NOSE SURGERY      There were no vitals filed for this visit.   Subjective Assessment - 08/25/21 1010     Subjective No change since last visit. My hip has been bothering me after sitting 45-60 min. Having some pain/pull in the left distal HS and medial calf with HS stretch. Still having pain with left hip IR    Pertinent History achilles tendon repair 2012 L, hiatal hernia, spinal injections    Patient Stated Goals Reduce pain.    Currently in Pain? Yes    Pain Score 3     Pain Location Hip    Pain Orientation Left    Pain Descriptors / Indicators Aching;Sore                               [facility] Adult PT Treatment/Exercise - 08/25/21 0001       Lumbar Exercises: Stretches   Standing Extension 20 reps    Standing Extension Limitations after bike as felt in hip upon standing    Other Lumbar Stretch Exercise groin stretch with foot on mat table and lunging forward and on angle x 15 sec  bil; seated Windshield wiper stretch for hip IR/ER x 10 bil    Other Lumbar Stretch Exercise seated sciatic nerve glide x 10 L side with 5sec on/off; also tried in supine x 5 but more difficult      Lumbar Exercises: Aerobic   Recumbent Bike L3 x 5      Modalities   Modalities Traction      Traction   Type of Traction Lumbar    Min (lbs) 80    Max (lbs) 110    Hold Time 60    Rest Time 20    Time 10      Manual Therapy   Manual Therapy Joint mobilization    Joint Mobilization L hip ant mobs, supine L hip distraction and post mobs                     PT Education - 08/25/21 1056     Education Details HEP    Person(s) Educated Patient    Methods Explanation;Demonstration;Handout;Verbal cues    Comprehension Verbalized understanding;Returned demonstration  PT Short Term Goals - 08/11/21 1138       PT SHORT TERM GOAL #1   Title ind with initial HEP    Status Achieved               PT Long Term Goals - 08/25/21 1018       PT LONG TERM GOAL #1   Title decreased left radicular sx by >= 75% to improve QOL.    Baseline 90%    Status Achieved      PT LONG TERM GOAL #2   Title increased standing tolerance to >= 30 min to allow meal prep    Baseline 30-45 min    Status Partially Met      PT LONG TERM GOAL #3   Title Patient able to diminish or abolish LE sx with positioning to increase activity tolerance.    Baseline sitting x 5 min abolishes burning in bil thighs    Status Partially Met                   Plan - 08/25/21 1054     Clinical Impression Statement Steven Hebert is progressing well with his LTGs, however he continues to have pain after sitting for 45 min and when standing and turning left. He also has pain on the left with his HS stretch. Advised to begin a nerve glide in sitting as well as standing extensions after prolonged sitting. He has marked tightness in right hip IR > Left. He may benefit from right hip mobs next  visit. He liked traction but noticed no significant difference last visit. Pull was increased today by 10#. Recert or d/c due next visit.    PT Frequency 1x / week    PT Duration 6 weeks    PT Treatment/Interventions ADLs/Self Care Home Management;Cryotherapy;Electrical Stimulation;Iontophoresis 29m/ml Dexamethasone;Moist Heat;Traction;Therapeutic activities;Therapeutic exercise;Neuromuscular re-education;Patient/family education;Manual techniques;Dry needling    PT Next Visit Plan Recert or d/c to MD. Continue to progress traction, right hip mobs?    PT Home Exercise Plan [id number]   Consulted and Agree with Plan of Care Patient             Patient will benefit from skilled therapeutic intervention in order to improve the following deficits and impairments:  Decreased activity tolerance, Decreased strength, Hypomobility, Impaired sensation, Pain, Impaired flexibility, Increased muscle spasms  Visit Diagnosis: Radiculopathy, lumbar region  Muscle weakness (generalized)  Cramp and spasm     Problem List Patient Active Problem List   Diagnosis Date Noted   Mallet deformity of left ring finger 07/07/2021   Anxiety 01/19/2018   Cervical radiculitis 01/19/2018   DDD (degenerative disc disease), cervical 01/19/2018   Esophageal reflux 01/19/2018   Essential hypertension 01/19/2018   Hiatal hernia 01/19/2018   Low back pain 01/19/2018   Meralgia paresthetica of left side 01/19/2018   Mixed hyperlipidemia 01/19/2018   Sleep disorder 01/19/2018   Chronic prescription benzodiazepine use 11/24/2017   Lumbar neuritis 12/07/2013   Diverticulosis of colon 06/08/2013   Lumbar spondylosis 05/24/2012   Essential tremor 10/01/2010   JMadelyn Flavors PT 08/25/2021, 12:00 PM  [place] [place] [address]Phone: [phone]  Fax:  [phone] Name: Steven STROHECKERMRN: [id number] of Birth:  304/18/1957

## 2021-08-31 ENCOUNTER — Ambulatory Visit (INDEPENDENT_AMBULATORY_CARE_PROVIDER_SITE_OTHER): Payer: Medicare Other | Admitting: Family Medicine

## 2021-08-31 ENCOUNTER — Encounter: Payer: Self-pay | Admitting: Family Medicine

## 2021-08-31 DIAGNOSIS — Z87898 Personal history of other specified conditions: Secondary | ICD-10-CM | POA: Diagnosis not present

## 2021-08-31 DIAGNOSIS — G479 Sleep disorder, unspecified: Secondary | ICD-10-CM

## 2021-08-31 DIAGNOSIS — I10 Essential (primary) hypertension: Secondary | ICD-10-CM

## 2021-08-31 MED ORDER — VALSARTAN-HYDROCHLOROTHIAZIDE 160-12.5 MG PO TABS: 1.0000 | ORAL_TABLET | Freq: Every day | ORAL | 3 refills | Status: DC

## 2021-08-31 NOTE — Assessment & Plan Note (Signed)
Remains on acamprosate.  Has been able to mostly abstain from alcohol use.

## 2021-08-31 NOTE — Assessment & Plan Note (Signed)
Insomnia remains well controlled with combination of trazodone with occasional alprazolam as needed.

## 2021-08-31 NOTE — Assessment & Plan Note (Signed)
Blood pressure elevated in clinic today.  Discontinue losartan.  Start valsartan/hydrochlorothiazide.  Return in 2 weeks for blood pressure check.

## 2021-08-31 NOTE — Patient Instructions (Signed)
Stop losartan.   Start valsartan/hctz.    Return in 2 weeks for BP recheck.

## 2021-08-31 NOTE — Progress Notes (Signed)
Steven Hebert - 66 y.o. male MRN [id number]  Date of birth: 08/13/1955  Subjective Chief Complaint  Patient presents with   Tinnitus   Hypertension    HPI Steven Hebert is a 66 year old male here today for follow-up visit.  Reports overall he is doing well.  Blood pressure remains elevated.  Tolerating losartan well at this time.  He has had some intermittent tinnitus.  He denies chest pain, shortness of breath, palpitations, headaches or vision changes.  Continues on pravastatin for management of hyperlipidemia.  He is tolerating this well.  Remains on acamprosate for history of alcohol dependence.  He remains abstinent during the week and drinks minimally on the weekends.  ROS:  A comprehensive ROS was completed and negative except as noted per HPI    Allergies  Allergen Reactions   Nsaids Other (See Comments)   Penicillins Rash    Past Medical History:  Diagnosis Date   Alcohol abuse    H/O degenerative disc disease    High blood pressure    High cholesterol     Past Surgical History:  Procedure Laterality Date   ACHILLES TENDON REPAIR     APPENDECTOMY     HERNIA REPAIR     NOSE SURGERY      Social History   Socioeconomic History   Marital status: Married    Spouse name: Verlon Au   Number of children: 2   Years of education: 14   Highest education level: Some college, no degree  Occupational History   Occupation: Pharmacist, hospital: [employer]    Comment: Part time  Tobacco Use   Smoking status: Former    Packs/day: 0.50    Years: 10.00    Total pack years: 5.00    Types: Cigarettes    Quit date: 1977    Years since quitting: 46.6   Smokeless tobacco: Never  Vaping Use   Vaping Use: Never used  Substance and Sexual Activity   Alcohol use: Yes    Alcohol/week: 28.0 standard drinks of alcohol    Types: 28 Cans of beer per week    Comment: Quit 07/29/20 (had wine 09/20/20)   Drug use: Not Currently    Types: Marijuana   Sexual  activity: Yes    Partners: Female, Male  Other Topics Concern   Not on file  Social History Narrative   Lives with his wife. He is working part-time. He enjoys woodworking.   Social Determinants of Health   Financial Resource Strain: Low Risk  (09/26/2020)   Overall Financial Resource Strain (CARDIA)    Difficulty of Paying Living Expenses: Not hard at all  Food Insecurity: No Food Insecurity (09/26/2020)   Hunger Vital Sign    Worried About Running Out of Food in the Last Year: Never true    Ran Out of Food in the Last Year: Never true  Transportation Needs: No Transportation Needs (09/26/2020)   PRAPARE - Administrator, Civil Service (Medical): No    Lack of Transportation (Non-Medical): No  Physical Activity: Sufficiently Active (09/26/2020)   Exercise Vital Sign    Days of Exercise per Week: 5 days    Minutes of Exercise per Session: 50 min  Stress: No Stress Concern Present (09/26/2020)   [employer] of Occupational Health - Occupational Stress Questionnaire    Feeling of Stress : Not at all  Social Connections: Moderately Integrated (09/26/2020)   Social Connection and Isolation Panel [NHANES]    Frequency  of Communication with Friends and Family: More than three times a week    Frequency of Social Gatherings with Friends and Family: Once a week    Attends Religious Services: 1 to 4 times per year    Active Member of [employer] or Organizations: No    Attends Banker Meetings: Never    Marital Status: Married    History reviewed. No pertinent family history.  Health Maintenance  Topic Date Due   COVID-19 Vaccine (4 - Booster) 09/16/2021 (Originally 01/09/2021)   Pneumonia Vaccine 24+ Years old (3 - PPSV23 or PCV20) 09/01/2022 (Originally 04/28/2021)   INFLUENZA VACCINE  09/01/2021   COLONOSCOPY (Pts 45-66yrs Insurance coverage will need to be confirmed)  08/15/2022   TETANUS/TDAP  05/24/2027   Hepatitis C Screening  Completed   Zoster Vaccines-  Shingrix  Completed   HPV VACCINES  Aged Out     ----------------------------------------------------------------------------------------------------------------------------------------------------------------------------------------------------------------- Physical Exam BP (!) 151/78 (BP Location: Left Arm, Patient Position: Sitting, Cuff Size: Large)   Pulse 80   Ht 6' (1.829 m)   Wt 223 lb (101.2 kg)   SpO2 95%   BMI 30.24 kg/m   Physical Exam Constitutional:      Appearance: Normal appearance.  HENT:     Right Ear: Tympanic membrane normal.     Left Ear: Tympanic membrane normal.  Eyes:     General: No scleral icterus. Cardiovascular:     Rate and Rhythm: Normal rate and regular rhythm.  Pulmonary:     Effort: Pulmonary effort is normal.     Breath sounds: Normal breath sounds.  Musculoskeletal:     Cervical back: Neck supple.  Neurological:     Mental Status: He is alert.  Psychiatric:        Mood and Affect: Mood normal.        Behavior: Behavior normal.     ------------------------------------------------------------------------------------------------------------------------------------------------------------------------------------------------------------------- Assessment and Plan  Essential hypertension Blood pressure elevated in clinic today.  Discontinue losartan.  Start valsartan/hydrochlorothiazide.  Return in 2 weeks for blood pressure check.  Sleep disorder Insomnia remains well controlled with combination of trazodone with occasional alprazolam as needed.  History of alcohol use disorder Remains on acamprosate.  Has been able to mostly abstain from alcohol use.   Meds ordered this encounter  Medications   valsartan-hydrochlorothiazide (DIOVAN-HCT) 160-12.5 MG tablet    Sig: Take 1 tablet by mouth daily.    Dispense:  90 tablet    Refill:  3    Return in about 6 months (around 03/03/2022) for HTN.    This visit occurred during the  SARS-CoV-2 public health emergency.  Safety protocols were in place, including screening questions prior to the visit, additional usage of staff PPE, and extensive cleaning of exam room while observing appropriate contact time as indicated for disinfecting solutions.

## 2021-09-02 ENCOUNTER — Encounter: Payer: Self-pay | Admitting: Physical Therapy

## 2021-09-02 ENCOUNTER — Ambulatory Visit (INDEPENDENT_AMBULATORY_CARE_PROVIDER_SITE_OTHER): Payer: Medicare Other | Admitting: Sports Medicine

## 2021-09-02 ENCOUNTER — Ambulatory Visit: Payer: Medicare Other | Attending: Sports Medicine | Admitting: Physical Therapy

## 2021-09-02 DIAGNOSIS — R252 Cramp and spasm: Secondary | ICD-10-CM | POA: Insufficient documentation

## 2021-09-02 DIAGNOSIS — M6281 Muscle weakness (generalized): Secondary | ICD-10-CM | POA: Diagnosis present

## 2021-09-02 DIAGNOSIS — M5416 Radiculopathy, lumbar region: Secondary | ICD-10-CM | POA: Diagnosis not present

## 2021-09-02 DIAGNOSIS — M20012 Mallet finger of left finger(s): Secondary | ICD-10-CM | POA: Diagnosis not present

## 2021-09-02 DIAGNOSIS — M47816 Spondylosis without myelopathy or radiculopathy, lumbar region: Secondary | ICD-10-CM | POA: Diagnosis not present

## 2021-09-02 NOTE — Therapy (Signed)
[place], [place], [address] Phone: [phone]   Fax:  [phone]  Physical Therapy Treatment  Patient Details  Name: Steven Hebert MRN: [id number] Date of Birth: [birth year] Referring Provider (PT): Silverio Decamp, MD   Encounter Date: 09/02/2021   PT End of Session - 09/02/21 0847     Visit Number 6    Number of Visits 12    Date for PT Re-Evaluation 10/14/21    PT Start Time 2060    PT Stop Time 0930    PT Time Calculation (min) 43 min    Activity Tolerance Patient tolerated treatment well    Behavior During Therapy [facility] for tasks assessed/performed             Past Medical History:  Diagnosis Date   Alcohol abuse    H/O degenerative disc disease    High blood pressure    High cholesterol     Past Surgical History:  Procedure Laterality Date   ACHILLES TENDON REPAIR     APPENDECTOMY     HERNIA REPAIR     NOSE SURGERY      There were no vitals filed for this visit.   Subjective Assessment - 09/02/21 0851     Subjective Pt reports he is not sure if the traction is helping him but would like to continue. Pt states he's been doing hamstring stretches. Continued slight pain with left hip IR with foot planted. Pt reports continued burning in thigh and numbness in his foot in the evenings.    Pertinent History achilles tendon repair 2012 L, hiatal hernia, spinal injections    Limitations Sitting;Standing;Walking    How long can you stand comfortably? 1-2 min    How long can you walk comfortably? better if walking fast    Patient Stated Goals Reduce pain.    Currently in Pain? Yes    Pain Score 2     Pain Location Hip    Pain Orientation Left    Pain Descriptors / Indicators Aching;Sore    Pain Type Acute pain                [facility] PT Assessment - 09/02/21 0001       Assessment   Medical Diagnosis M54.16 (ICD-10-CM) - Left lumbar radiculopathy    Referring  Provider (PT) Silverio Decamp, MD    Onset Date/Surgical Date 07/02/21                           [facility] Adult PT Treatment/Exercise - 09/02/21 0001       Lumbar Exercises: Stretches   Active Hamstring Stretch Right;Left;1 rep;30 seconds    Active Hamstring Stretch Limitations seated    Hip Flexor Stretch Right;Left;30 seconds    Piriformis Stretch Right;Left;30 seconds;2 reps    Other Lumbar Stretch Exercise butterfly stretch 2x30 sec      Traction   Type of Traction Lumbar    Min (lbs) 80    Max (lbs) 115    Hold Time 60    Rest Time 20    Time 10      Manual Therapy   Joint Mobilization L hip ant mobs prone, supine L hip lateral mobs and post/inferior mobs                       PT Short Term Goals - 08/11/21 1138  PT SHORT TERM GOAL #1   Title ind with initial HEP    Status Achieved             Prior goals:   PT Long Term Goals - 09/02/21 0910       PT LONG TERM GOAL #1   Title decreased left radicular sx by >= 75% to improve QOL.    Baseline 90%    Status Achieved      PT LONG TERM GOAL #2   Title increased standing tolerance to >= 30 min to allow meal prep    Baseline 30-45 min    Status Partially Met      PT LONG TERM GOAL #3   Title Patient able to diminish or abolish LE sx with positioning to increase activity tolerance.    Baseline sitting x 5 min abolishes burning in bil thighs    Status Partially Met             Revised goals:  PT Long Term Goals - 09/02/21 0910       PT LONG TERM GOAL #1   Title decreased left radicular sx by >= 95% to improve QOL.    Baseline 90%    Time 6    Period Weeks    Status Revised    Target Date 10/14/21      PT LONG TERM GOAL #2   Title increased standing tolerance to >= 45 min to allow meal prep    Baseline 30-45 min    Status Revised    Target Date 10/14/21      PT LONG TERM GOAL #3   Title Pt will report no pain with standing hip IR during left turns     Baseline --    Status Revised    Target Date 10/14/21                   Plan - 09/02/21 7253     Clinical Impression Statement Steven Hebert is progressing well towards his LTGs. Noted limited ant L hip joint mobility resulting in pain with hip extension as well as with L hip IR in standing/weight bearing. Would benefit from continued L hip mobilization and hip flexor stretching. Continued traction per pt request as he has continued bilat ant thigh N/T with prolonged standing. At this point, his goals are partially met but would benefit from continued PT to fully meet all of his LTGs.    Personal Factors and Comorbidities Comorbidity 1    Comorbidities lumbar DDD    PT Frequency 1x / week    PT Duration 6 weeks    PT Treatment/Interventions ADLs/Self Care Home Management;Cryotherapy;Electrical Stimulation;Iontophoresis 70m/ml Dexamethasone;Moist Heat;Traction;Therapeutic activities;Therapeutic exercise;Neuromuscular re-education;Patient/family education;Manual techniques;Dry needling    PT Next Visit Plan Continue to progress traction, right hip ant mobs. Work on hip extensor strengthening, hip flexor stretching    PT Home Exercise Plan [phone]   Consulted and Agree with Plan of Care Patient             Patient will benefit from skilled therapeutic intervention in order to improve the following deficits and impairments:  Decreased activity tolerance, Decreased strength, Hypomobility, Impaired sensation, Pain, Impaired flexibility, Increased muscle spasms  Visit Diagnosis: Radiculopathy, lumbar region  Muscle weakness (generalized)  Cramp and spasm     Problem List Patient Active Problem List   Diagnosis Date Noted   History of alcohol use disorder 08/31/2021   Mallet deformity of left ring finger 07/07/2021  Anxiety 01/19/2018   Cervical radiculitis 01/19/2018   DDD (degenerative disc disease), cervical 01/19/2018   Esophageal reflux 01/19/2018   Essential  hypertension 01/19/2018   Hiatal hernia 01/19/2018   Low back pain 01/19/2018   Meralgia paresthetica of left side 01/19/2018   Mixed hyperlipidemia 01/19/2018   Sleep disorder 01/19/2018   Chronic prescription benzodiazepine use 11/24/2017   Lumbar neuritis 12/07/2013   Diverticulosis of colon 06/08/2013   Lumbar spondylosis 05/24/2012   Essential tremor 10/01/2010    [facility] April Ma L [place], [place], DPT 09/02/2021, 9:34 AM  [facility] [place], [place], [address] Phone: [phone]   Fax:  [phone]  Name: Steven Hebert MRN: [id number] Date of Birth: 1956-01-03

## 2021-09-02 NOTE — Progress Notes (Signed)
    Procedures performed today:    None.  Independent interpretation of notes and tests performed by another provider:   None.  Brief History, Exam, Impression, and Recommendations:    Mallet deformity of left ring finger Pleasant 66 year old male returns, he has a soft tissue mallet, now splinted for 8 weeks, he is doing well, we removed his stack splint and he has good strength to extension at the DIP, he will start home physical therapy and return to see me as needed.  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 At the last visit we bumped up his Lyrica to 200 mg 3 times daily, refilled Celebrex, he really does not have much back pain now, principal symptom is left leg paresthesias. I explained that lumbar laminectomy could potentially help his paresthesias although efficacy for axial back pain was not as great. For this reason I would like him to get a consultation with Dr. Franky Macho downstairs.    ____________________________________________ Ihor Austin. Benjamin Stain, M.D., ABFM., CAQSM., AME. Primary Care and Sports Medicine [facility]  Adjunct Professor of Family Medicine  [employer] [employer]  Restaurant manager, fast food

## 2021-09-02 NOTE — Assessment & Plan Note (Signed)
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 At the last visit we bumped up his Lyrica to 200 mg 3 times daily, refilled Celebrex, he really does not have much back pain now, principal symptom is left leg paresthesias. I explained that lumbar laminectomy could potentially help his paresthesias although efficacy for axial back pain was not as great. For this reason I would like him to get a consultation with Dr. Franky Macho downstairs.

## 2021-09-02 NOTE — Assessment & Plan Note (Signed)
Pleasant 66 year old male returns, he has a soft tissue mallet, now splinted for 8 weeks, he is doing well, we removed his stack splint and he has good strength to extension at the DIP, he will start home physical therapy and return to see me as needed.

## 2021-09-09 ENCOUNTER — Ambulatory Visit: Payer: Medicare Other | Admitting: Physical Therapy

## 2021-09-09 ENCOUNTER — Encounter: Payer: Self-pay | Admitting: Physical Therapy

## 2021-09-09 DIAGNOSIS — M6281 Muscle weakness (generalized): Secondary | ICD-10-CM

## 2021-09-09 DIAGNOSIS — M5416 Radiculopathy, lumbar region: Secondary | ICD-10-CM | POA: Diagnosis not present

## 2021-09-09 DIAGNOSIS — R252 Cramp and spasm: Secondary | ICD-10-CM

## 2021-09-09 NOTE — Therapy (Signed)
[facility] [place] [address] [place] [address] [place], [place], [address] Phone: [phone]   Fax:  [phone]  Physical Therapy Treatment  Patient Details  Name: Steven Hebert MRN: [id number] Date of Birth: 28-Jul-1955 Referring Provider (PT): Monica Becton, MD   Encounter Date: 09/09/2021   PT End of Session - 09/09/21 0927     Visit Number 7    Number of Visits 12    Date for PT Re-Evaluation 10/14/21    PT Start Time 0930    PT Stop Time 1010    PT Time Calculation (min) 40 min    Activity Tolerance Patient tolerated treatment well    Behavior During Therapy [facility] for tasks assessed/performed             Past Medical History:  Diagnosis Date   Alcohol abuse    H/O degenerative disc disease    High blood pressure    High cholesterol     Past Surgical History:  Procedure Laterality Date   ACHILLES TENDON REPAIR     APPENDECTOMY     HERNIA REPAIR     NOSE SURGERY      There were no vitals filed for this visit.   Subjective Assessment - 09/09/21 0935     Subjective Pt reports some hip soreness this morning. Did leg day yesterday. Pt reports his leg is hurting less this week than last week. Notes hurting his right side earlier this week.    Pertinent History achilles tendon repair 2012 L, hiatal hernia, spinal injections    Limitations Sitting;Standing;Walking    How long can you stand comfortably? 1-2 min    How long can you walk comfortably? better if walking fast    Patient Stated Goals Reduce pain.    Currently in Pain? Yes    Pain Location Hip    Pain Orientation Left                [facility] PT Assessment - 09/09/21 0001       Assessment   Medical Diagnosis M54.16 (ICD-10-CM) - Left lumbar radiculopathy    Referring Provider (PT) Monica Becton, MD    Onset Date/Surgical Date 07/02/21                           [facility] Adult PT Treatment/Exercise - 09/09/21 0001        Lumbar Exercises: Aerobic   Recumbent Bike L3 x 5 min      Knee/Hip Exercises: Stretches   Hip Flexor Stretch Right;2 reps;30 seconds    Hip Flexor Stretch Limitations standing    Other Knee/Hip Stretches Thomas stretch seated 2x30 sec      Knee/Hip Exercises: Prone   Hip Extension Strengthening;Right;Left;10 reps    Other Prone Exercises reverse clam green tband 2x10      Manual Therapy   Manual therapy comments contract/relax R&L for hip IR    Joint Mobilization L&R hip ant mobs prone, L&R hip lateral mobs    Soft tissue mobilization STM R transversus abdominis and obliques                       PT Short Term Goals - 08/11/21 1138       PT SHORT TERM GOAL #1   Title ind with initial HEP    Status Achieved               PT Long  Term Goals - 09/02/21 0910       PT LONG TERM GOAL #1   Title decreased left radicular sx by >= 95% to improve QOL.    Baseline 90%    Time 6    Period Weeks    Status Revised    Target Date 10/14/21      PT LONG TERM GOAL #2   Title increased standing tolerance to >= 45 min to allow meal prep    Baseline 30-45 min    Status Revised    Target Date 10/14/21      PT LONG TERM GOAL #3   Title Pt will report no pain with standing hip IR during left turns    Baseline --    Status Revised    Target Date 10/14/21                   Plan - 09/09/21 1021     Clinical Impression Statement Continued hip strengthening and increasing hip flexibility. Continued manual work to improve hip joint hypomobility. Pt with increased R oblique/TSA trigger points this session also addressed with manual therapy.    Personal Factors and Comorbidities Comorbidity 1    Comorbidities lumbar DDD    PT Frequency 1x / week    PT Duration 6 weeks    PT Treatment/Interventions ADLs/Self Care Home Management;Cryotherapy;Electrical Stimulation;Iontophoresis 4mg /ml Dexamethasone;Moist Heat;Traction;Therapeutic activities;Therapeutic  exercise;Neuromuscular re-education;Patient/family education;Manual techniques;Dry needling    PT Next Visit Plan Continue to progress traction, right hip ant mobs. Work on hip extensor strengthening, hip flexor stretching    PT Home Exercise Plan [phone]    Consulted and Agree with Plan of Care Patient             Patient will benefit from skilled therapeutic intervention in order to improve the following deficits and impairments:  Decreased activity tolerance, Decreased strength, Hypomobility, Impaired sensation, Pain, Impaired flexibility, Increased muscle spasms  Visit Diagnosis: Radiculopathy, lumbar region  Muscle weakness (generalized)  Cramp and spasm     Problem List Patient Active Problem List   Diagnosis Date Noted   History of alcohol use disorder 08/31/2021   Mallet deformity of left ring finger 07/07/2021   Anxiety 01/19/2018   Cervical radiculitis 01/19/2018   DDD (degenerative disc disease), cervical 01/19/2018   Esophageal reflux 01/19/2018   Essential hypertension 01/19/2018   Hiatal hernia 01/19/2018   Low back pain 01/19/2018   Meralgia paresthetica of left side 01/19/2018   Mixed hyperlipidemia 01/19/2018   Sleep disorder 01/19/2018   Chronic prescription benzodiazepine use 11/24/2017   Lumbar neuritis 12/07/2013   Diverticulosis of colon 06/08/2013   Lumbar spondylosis 05/24/2012   Essential tremor 10/01/2010    [facility] [address] [place], [place], DPT 09/09/2021, 10:24 AM  [facility] [address] [place] [address] [place], [place], [place] Phone: [phone]   Fax:  [phone]  Name: Steven Hebert MRN: [name] Date of Birth: 1955-07-11

## 2021-09-14 ENCOUNTER — Ambulatory Visit (INDEPENDENT_AMBULATORY_CARE_PROVIDER_SITE_OTHER): Payer: Medicare Other | Admitting: Family Medicine

## 2021-09-14 VITALS — BP 152/77 | HR 92

## 2021-09-14 DIAGNOSIS — I10 Essential (primary) hypertension: Secondary | ICD-10-CM | POA: Diagnosis not present

## 2021-09-14 NOTE — Progress Notes (Signed)
   Established Patient Office Visit  Subjective   Patient ID: Steven Hebert, male    DOB: 06-Mar-1955  Age: 66 y.o. MRN: [id number]  Chief Complaint  Patient presents with   Hypertension    HPI  Steven Hebert is here for blood pressure check. Denies chest pain, shortness of breath or dizziness.   ROS    Objective:     BP (!) 152/77   Pulse 92   SpO2 98%    Physical Exam   No results found for any visits on 09/14/21.    The 10-year ASCVD risk score (Arnett DK, et al., 2019) is: 25.5%    Assessment & Plan:  Hypertension - Patient advised to increase the Valsartan-HCTZ 160/12.5 mg to two tablets daily. Follow up in 2 weeks for nurse visit blood pressure check.   Problem List Items Addressed This Visit       Unprioritized   Essential hypertension - Primary    Return in about 2 weeks (around 09/28/2021) for blood pressure check with a nurse visit. Earna Coder, Janalyn Harder, CMA

## 2021-09-14 NOTE — Progress Notes (Signed)
Medical screening examination/treatment was performed by qualified clinical staff member and as supervising physician I was immediately available for consultation/collaboration. I have reviewed documentation and agree with assessment and plan.  Ioana Louks, DO  

## 2021-09-15 ENCOUNTER — Ambulatory Visit: Payer: Medicare Other | Admitting: Physical Therapy

## 2021-09-15 ENCOUNTER — Encounter: Payer: Self-pay | Admitting: Physical Therapy

## 2021-09-15 DIAGNOSIS — M5416 Radiculopathy, lumbar region: Secondary | ICD-10-CM | POA: Diagnosis not present

## 2021-09-15 DIAGNOSIS — M6281 Muscle weakness (generalized): Secondary | ICD-10-CM

## 2021-09-15 DIAGNOSIS — R252 Cramp and spasm: Secondary | ICD-10-CM

## 2021-09-15 NOTE — Therapy (Addendum)
[place] [place], [place], [address] Phone: [phone]   Fax:  [phone]  Physical Therapy Treatment and Discharge  Patient Details  Name: Steven Hebert MRN: [id number] Date of Birth: March 24, 1955 Referring Provider (PT): Silverio Decamp, MD  PHYSICAL THERAPY DISCHARGE SUMMARY  Visits from Start of Care: 8  Current functional level related to goals / functional outcomes: See below   Remaining deficits: See below   Education / Equipment: See below   Patient agrees to discharge. Patient goals were partially met. Patient is being discharged due to being pleased with the current functional level.   Encounter Date: 09/15/2021   PT End of Session - 09/15/21 0931     Visit Number 8    Number of Visits 12    Date for PT Re-Evaluation 10/14/21    PT Start Time 0930    PT Stop Time 1010    PT Time Calculation (min) 40 min    Activity Tolerance Patient tolerated treatment well    Behavior During Therapy WFL for tasks assessed/performed             Past Medical History:  Diagnosis Date   Alcohol abuse    H/O degenerative disc disease    High blood pressure    High cholesterol     Past Surgical History:  Procedure Laterality Date   ACHILLES TENDON REPAIR     APPENDECTOMY     HERNIA REPAIR     NOSE SURGERY      There were no vitals filed for this visit.   Subjective Assessment - 09/15/21 0932     Subjective Pt reports that he does feel better with his hip stretches. Improved pain on his right side. Notes he was able to stand and make grits this weekend without burning down his left leg.    Pertinent History achilles tendon repair 2012 L, hiatal hernia, spinal injections    Limitations Sitting;Standing;Walking    How long can you stand comfortably? 1-2 min    How long can you walk comfortably? better if walking fast    Patient Stated Goals Reduce pain.    Currently in Pain?  No/denies                [facility] PT Assessment - 09/15/21 0001       Assessment   Medical Diagnosis M54.16 (ICD-10-CM) - Left lumbar radiculopathy    Referring Provider (PT) Silverio Decamp, MD    Onset Date/Surgical Date 07/02/21                           [facility] Adult PT Treatment/Exercise - 09/15/21 0001       Lumbar Exercises: Aerobic   Recumbent Bike L3 x 5 min      Lumbar Exercises: Standing   Shoulder Extension Strengthening;20 reps;Theraband    Theraband Level (Shoulder Extension) Level 4 (Blue)    Shoulder Extension Limitations reactive iso    Other Standing Lumbar Exercises side stepping anti rotation green tband doubled 2x10      Knee/Hip Exercises: Stretches   Hip Flexor Stretch Right;2 reps;30 seconds    Hip Flexor Stretch Limitations seated      Knee/Hip Exercises: Prone   Hip Extension Strengthening;Right;Left;10 reps    Other Prone Exercises reverse clam green tband 2x10      Manual Therapy   Joint Mobilization L&R hip ant mobs prone, L&R hip lateral mobs  PT Short Term Goals - 08/11/21 1138       PT SHORT TERM GOAL #1   Title ind with initial HEP    Status Achieved               PT Long Term Goals - 09/02/21 0910       PT LONG TERM GOAL #1   Title decreased left radicular sx by >= 95% to improve QOL.    Baseline 90%    Time 6    Period Weeks    Status Revised    Target Date 10/14/21      PT LONG TERM GOAL #2   Title increased standing tolerance to >= 45 min to allow meal prep    Baseline 30-45 min    Status Revised    Target Date 10/14/21      PT LONG TERM GOAL #3   Title Pt will report no pain with standing hip IR during left turns    Baseline --    Status Revised    Target Date 10/14/21                   Plan - 09/15/21 1000     Clinical Impression Statement Session focused on continuing to improve hip strength and flexibility (specifically into hip  extension). Initiated standing core/hip strengthening.    Personal Factors and Comorbidities Comorbidity 1    Comorbidities lumbar DDD    PT Frequency 1x / week    PT Duration 6 weeks    PT Treatment/Interventions ADLs/Self Care Home Management;Cryotherapy;Electrical Stimulation;Iontophoresis 3m/ml Dexamethasone;Moist Heat;Traction;Therapeutic activities;Therapeutic exercise;Neuromuscular re-education;Patient/family education;Manual techniques;Dry needling    PT Next Visit Plan Continue to progress traction, right hip ant mobs. Work on hip extensor strengthening, hip flexor stretching    PT Home Exercise Plan [id number]   Consulted and Agree with Plan of Care Patient             Patient will benefit from skilled therapeutic intervention in order to improve the following deficits and impairments:  Decreased activity tolerance, Decreased strength, Hypomobility, Impaired sensation, Pain, Impaired flexibility, Increased muscle spasms  Visit Diagnosis: Radiculopathy, lumbar region  Muscle weakness (generalized)  Cramp and spasm     Problem List Patient Active Problem List   Diagnosis Date Noted   History of alcohol use disorder 08/31/2021   Mallet deformity of left ring finger 07/07/2021   Anxiety 01/19/2018   Cervical radiculitis 01/19/2018   DDD (degenerative disc disease), cervical 01/19/2018   Esophageal reflux 01/19/2018   Essential hypertension 01/19/2018   Hiatal hernia 01/19/2018   Low back pain 01/19/2018   Meralgia paresthetica of left side 01/19/2018   Mixed hyperlipidemia 01/19/2018   Sleep disorder 01/19/2018   Chronic prescription benzodiazepine use 11/24/2017   Lumbar neuritis 12/07/2013   Diverticulosis of colon 06/08/2013   Lumbar spondylosis 05/24/2012   Essential tremor 10/01/2010    [facility]ianae PT, DPT 09/15/2021, 10:10 AM  [facility] [place]  [address]Phone: [phone]  Fax:  [phone] Name: Steven RIEMANMRN: [id number] of Birth: [birth year]

## 2021-09-23 ENCOUNTER — Encounter: Payer: Medicare Other | Admitting: Physical Therapy

## 2021-09-28 ENCOUNTER — Ambulatory Visit (INDEPENDENT_AMBULATORY_CARE_PROVIDER_SITE_OTHER): Payer: Medicare Other | Admitting: Family Medicine

## 2021-09-28 VITALS — BP 145/82 | HR 87

## 2021-09-28 DIAGNOSIS — I10 Essential (primary) hypertension: Secondary | ICD-10-CM | POA: Diagnosis not present

## 2021-09-28 NOTE — Progress Notes (Signed)
Pt here for nurse BP check.  Pt denies CP, SOB, headaches, dizziness or missed doses of medications.  T. Leinani Lisbon, CMA ? ?

## 2021-09-28 NOTE — Progress Notes (Signed)
Medical screening examination/treatment was performed by qualified clinical staff member and as supervising physician I was immediately available for consultation/collaboration. I have reviewed documentation and agree with assessment and plan.  Nthony Lefferts, DO  

## 2021-09-29 ENCOUNTER — Ambulatory Visit
Admission: EM | Admit: 2021-09-29 | Discharge: 2021-09-29 | Disposition: A | Discharge status: Home / Self Care | Payer: Medicare Other | Attending: Family Medicine | Admitting: Family Medicine

## 2021-09-29 ENCOUNTER — Encounter: Payer: Self-pay | Admitting: Emergency Medicine

## 2021-09-29 ENCOUNTER — Ambulatory Visit (INDEPENDENT_AMBULATORY_CARE_PROVIDER_SITE_OTHER): Payer: Medicare Other

## 2021-09-29 DIAGNOSIS — M25542 Pain in joints of left hand: Secondary | ICD-10-CM | POA: Diagnosis not present

## 2021-09-29 DIAGNOSIS — S63651A Sprain of metacarpophalangeal joint of left index finger, initial encounter: Secondary | ICD-10-CM | POA: Diagnosis not present

## 2021-09-29 DIAGNOSIS — M5416 Radiculopathy, lumbar region: Secondary | ICD-10-CM

## 2021-09-29 DIAGNOSIS — M47816 Spondylosis without myelopathy or radiculopathy, lumbar region: Secondary | ICD-10-CM

## 2021-09-29 NOTE — ED Provider Notes (Signed)
Steven Hebert CARE    CSN: [id number] Arrival date & time: 09/29/21  0950      History   Chief Complaint Chief Complaint  Patient presents with   Hand Pain    HPI Steven Hebert is a 66 y.o. male.   HPI Patient states that his index finger on the left hand got stuck in a door handle and wrenched into an abducted position.  Painful and swollen.  Limited range of motion and grip strength secondary to pain.  Past Medical History:  Diagnosis Date   Alcohol abuse    H/O degenerative disc disease    High blood pressure    High cholesterol     Patient Active Problem List   Diagnosis Date Noted   History of alcohol use disorder 08/31/2021   Mallet deformity of left ring finger 07/07/2021   Anxiety 01/19/2018   Cervical radiculitis 01/19/2018   DDD (degenerative disc disease), cervical 01/19/2018   Esophageal reflux 01/19/2018   Essential hypertension 01/19/2018   Hiatal hernia 01/19/2018   Low back pain 01/19/2018   Meralgia paresthetica of left side 01/19/2018   Mixed hyperlipidemia 01/19/2018   Sleep disorder 01/19/2018   Chronic prescription benzodiazepine use 11/24/2017   Lumbar neuritis 12/07/2013   Diverticulosis of colon 06/08/2013   Lumbar spondylosis 05/24/2012   Essential tremor 10/01/2010    Past Surgical History:  Procedure Laterality Date   ACHILLES TENDON REPAIR     APPENDECTOMY     HERNIA REPAIR     NOSE SURGERY         Home Medications    Prior to Admission medications   Medication Sig Start Date End Date Taking? Authorizing Provider  acamprosate (CAMPRAL) 333 MG tablet TAKE 2 TABLETS BY MOUTH TWICE A DAY 05/17/18   Sunnie Nielsen, DO  ALPRAZolam Prudy Feeler) 0.5 MG tablet Take 0.5-1 tablets (0.25-0.5 mg total) by mouth 2 (two) times daily as needed for anxiety or sleep. 05/22/21   Everrett Coombe, DO  celecoxib (CELEBREX) 200 MG capsule One to 2 tablets by mouth daily as needed for pain. 08/05/21   Monica Becton, MD  omeprazole  (PRILOSEC OTC) 20 MG tablet Take 1 tablet (20 mg total) by mouth daily. 01/19/18   Sunnie Nielsen, DO  pravastatin (PRAVACHOL) 80 MG tablet Take 1 tablet (80 mg total) by mouth daily. 03/03/21   Everrett Coombe, DO  pregabalin (LYRICA) 200 MG capsule Take 1 capsule (200 mg total) by mouth in the morning, at noon, and at bedtime. 08/05/21   Monica Becton, MD  traZODone (DESYREL) 100 MG tablet TAKE 1 TABLET BY MOUTH EVERYDAY AT BEDTIME 07/29/21   Everrett Coombe, DO  valsartan-hydrochlorothiazide (DIOVAN-HCT) 160-12.5 MG tablet Take 1 tablet by mouth daily. 08/31/21   Everrett Coombe, DO    Family History History reviewed. No pertinent family history.  Social History Social History   Tobacco Use   Smoking status: Former    Packs/day: 0.50    Years: 10.00    Total pack years: 5.00    Types: Cigarettes    Quit date: 1977    Years since quitting: 46.6   Smokeless tobacco: Never  Vaping Use   Vaping Use: Never used  Substance Use Topics   Alcohol use: Yes    Alcohol/week: 28.0 standard drinks of alcohol    Types: 28 Cans of beer per week    Comment: Quit 07/29/20 (had wine 09/20/20)   Drug use: Not Currently    Types: Marijuana  Allergies   Nsaids and Penicillins   Review of Systems Review of Systems See HPI  Physical Exam Triage Vital Signs ED Triage Vitals  Enc Vitals Group     BP 09/29/21 1002 (!) 151/89     Pulse Rate 09/29/21 1002 78     Resp 09/29/21 1002 18     Temp 09/29/21 1002 97.8 F (36.6 C)     Temp Source 09/29/21 1002 Oral     SpO2 09/29/21 1002 98 %     Weight --      Height --      Head Circumference --      Peak Flow --      Pain Score 09/29/21 1004 8     Pain Loc --      Pain Edu? --      Excl. in GC? --    No data found.  Updated Vital Signs BP (!) 151/89 (BP Location: Right Arm)   Pulse 78   Temp 97.8 F (36.6 C) (Oral)   Resp 18   SpO2 98%      Physical Exam Constitutional:      General: He is not in acute distress.     Appearance: He is well-developed.  HENT:     Head: Normocephalic and atraumatic.  Eyes:     Conjunctiva/sclera: Conjunctivae normal.     Pupils: Pupils are equal, round, and reactive to light.  Cardiovascular:     Rate and Rhythm: Normal rate.  Pulmonary:     Effort: Pulmonary effort is normal. No respiratory distress.  Abdominal:     General: There is no distension.     Palpations: Abdomen is soft.  Musculoskeletal:        General: Normal range of motion.     Cervical back: Normal range of motion.     Comments: Left hand shows soft tissue swelling around the index finger, adjacent MCP region, into the thenar eminence.  Limited flexion noted.  Limited grip strength noted.  No instability identified  Skin:    General: Skin is warm and dry.  Neurological:     Mental Status: He is alert.     Gait: Gait normal.  Psychiatric:        Mood and Affect: Mood normal.        Behavior: Behavior normal.      UC Treatments / Results  Labs (all labs ordered are listed, but only abnormal results are displayed) Labs Reviewed - No data to display  EKG   Radiology DG Hand Complete Left  Result Date: 09/29/2021 CLINICAL DATA:  Left hand caught in door yesterday Second metacarpophalangeal joint pain EXAM: LEFT HAND - COMPLETE 3+ VIEW COMPARISON:  None available FINDINGS: Well corticated ossific fragment adjacent to the ulnar styloid process consistent with remote fracture. Mild degenerative changes of the first metacarpophalangeal joint. Mild degenerative changes of the distal interphalangeal joint of the index finger. No acute fracture or dislocation. Mild diffuse soft tissue swelling of the index finger. IMPRESSION: Mild diffuse soft tissue swelling of the index finger without underlying fracture or dislocation. Electronically Signed   By: Acquanetta Belling M.D.   On: 09/29/2021 10:21    Procedures Procedures (including critical care time)  Medications Ordered in UC Medications - No data to  display  Initial Impression / Assessment and Plan / UC Course  I have reviewed the triage vital signs and the nursing notes.  Pertinent labs & imaging results that were available during my care of  the patient were reviewed by me and considered in my medical decision making (see chart for details).      Final Clinical Impressions(s) / UC Diagnoses   Final diagnoses:  Sprain of metacarpophalangeal (MCP) joint of left index finger, initial encounter     Discharge Instructions      Take your Celebrex twice a day with food.  This will reduce pain and inflammation Use ice and elevation to reduce swelling Take hydrocodone if needed for severe pain See Dr. Benjamin Stain if you fail to improve as expected   ED Prescriptions   None    PDMP not reviewed this encounter.   Eustace Moore, MD 09/29/21 1036

## 2021-09-29 NOTE — ED Triage Notes (Signed)
Pt states he went to open a door yesterday and got his left second finger stuck in the door handle. He heard a pop and now has left hand pain and swelling. He is using ice pack

## 2021-09-29 NOTE — Discharge Instructions (Addendum)
Take your Celebrex twice a day with food.  This will reduce pain and inflammation Use ice and elevation to reduce swelling Take hydrocodone if needed for severe pain See Dr. Benjamin Stain if you fail to improve as expected

## 2021-10-21 ENCOUNTER — Other Ambulatory Visit: Payer: Self-pay

## 2021-10-21 ENCOUNTER — Other Ambulatory Visit: Payer: Self-pay | Admitting: Family Medicine

## 2021-10-21 MED ORDER — VALSARTAN-HYDROCHLOROTHIAZIDE 160-12.5 MG PO TABS: 2.0000 | ORAL_TABLET | Freq: Every day | ORAL | 1 refills | Status: DC

## 2022-02-16 ENCOUNTER — Other Ambulatory Visit: Payer: Self-pay | Admitting: Sports Medicine

## 2022-02-16 DIAGNOSIS — M47816 Spondylosis without myelopathy or radiculopathy, lumbar region: Secondary | ICD-10-CM

## 2022-02-16 DIAGNOSIS — M5416 Radiculopathy, lumbar region: Secondary | ICD-10-CM

## 2022-02-26 ENCOUNTER — Other Ambulatory Visit: Payer: Self-pay | Admitting: Family Medicine

## 2022-02-26 DIAGNOSIS — E782 Mixed hyperlipidemia: Secondary | ICD-10-CM

## 2022-03-03 ENCOUNTER — Ambulatory Visit (INDEPENDENT_AMBULATORY_CARE_PROVIDER_SITE_OTHER): Payer: Medicare Other | Admitting: Family Medicine

## 2022-03-03 ENCOUNTER — Ambulatory Visit (INDEPENDENT_AMBULATORY_CARE_PROVIDER_SITE_OTHER): Payer: Medicare Other

## 2022-03-03 ENCOUNTER — Encounter: Payer: Self-pay | Admitting: Family Medicine

## 2022-03-03 VITALS — BP 115/72 | HR 84 | Ht 72.0 in | Wt 223.0 lb

## 2022-03-03 DIAGNOSIS — Z7729 Contact with and (suspected ) exposure to other hazardous substances: Secondary | ICD-10-CM

## 2022-03-03 DIAGNOSIS — G6289 Other specified polyneuropathies: Secondary | ICD-10-CM | POA: Diagnosis not present

## 2022-03-03 DIAGNOSIS — R059 Cough, unspecified: Secondary | ICD-10-CM

## 2022-03-03 DIAGNOSIS — E782 Mixed hyperlipidemia: Secondary | ICD-10-CM

## 2022-03-03 DIAGNOSIS — Z87898 Personal history of other specified conditions: Secondary | ICD-10-CM | POA: Diagnosis not present

## 2022-03-03 DIAGNOSIS — I10 Essential (primary) hypertension: Secondary | ICD-10-CM | POA: Diagnosis not present

## 2022-03-03 DIAGNOSIS — R209 Unspecified disturbances of skin sensation: Secondary | ICD-10-CM | POA: Diagnosis not present

## 2022-03-03 DIAGNOSIS — R7309 Other abnormal glucose: Secondary | ICD-10-CM

## 2022-03-03 DIAGNOSIS — G629 Polyneuropathy, unspecified: Secondary | ICD-10-CM | POA: Insufficient documentation

## 2022-03-03 NOTE — Assessment & Plan Note (Signed)
Chest xray ordered

## 2022-03-03 NOTE — Assessment & Plan Note (Signed)
Blood pressure is well-controlled.  Recommend continuation of valsartan with hydrochlorothiazide for management of hypertension.

## 2022-03-03 NOTE — Patient Instructions (Signed)
Great to see you! Hope your wife comes home soon! Continue current medications.  Have chest xray completed downstairs.  See me again in 6 months.

## 2022-03-03 NOTE — Assessment & Plan Note (Signed)
Remains abstinent from alcohol during the week with occasional consumption on the weekends.  Continue acamprosate.

## 2022-03-03 NOTE — Assessment & Plan Note (Signed)
Likely related to his lumbar disc disease.  We did discuss other possibilities including vitamin deficiency and/or chronic alcohol use.  Updating A1c, B12, B1 and inflammatory markers.

## 2022-03-03 NOTE — Progress Notes (Signed)
Steven Hebert - 67 y.o. male MRN [id number]  Date of birth: November 05, 1955  Subjective No chief complaint on file.   HPI Steven Hebert is a 67 y.o. male here today for follow up visit.    Reports he is doing pretty well.  His wife recently had lobectomy for lung cancer.  He would like to have a CXR.  Recent cough and long term occupational exposure to silica.   Continues on valsartan/hct for management of HTN.  He reports that he is doing pretty well with this.  Denies side effects from medication.  BP is well controlled.  Has not had chest pain, shortness of breath, palpitations, headache or vision changes.   Continues on pravastatin for management of HLD.  Tolerating well without myalgias.    Has chronic lumbar radiculopathy.  Seen by orthopedics.  Remains on  lyrica for management of pain.  Per most recent note he is hesitant to have surgery.    He remains on acamprosate for history of alcohol use disorder.  He reports that he continues to avoid EtOH during the week.  Has occasional drink on the weekend.   ROS:  A comprehensive ROS was completed and negative except as noted per HPI  Allergies  Allergen Reactions   Nsaids Other (See Comments)   Penicillins Rash    Past Medical History:  Diagnosis Date   Alcohol abuse    H/O degenerative disc disease    High blood pressure    High cholesterol     Past Surgical History:  Procedure Laterality Date   ACHILLES TENDON REPAIR     APPENDECTOMY     HERNIA REPAIR     NOSE SURGERY      Social History   Socioeconomic History   Marital status: Married    Spouse name: Magda Paganini   Number of children: 2   Years of education: 14   Highest education level: Some college, no degree  Occupational History   Occupation: Midwife: [employer]    Comment: Part time  Tobacco Use   Smoking status: Former    Packs/day: 0.50    Years: 10.00    Total pack years: 5.00    Types: Cigarettes    Quit date: 1977     Years since quitting: 47.1   Smokeless tobacco: Never  Vaping Use   Vaping Use: Never used  Substance and Sexual Activity   Alcohol use: Yes    Alcohol/week: 28.0 standard drinks of alcohol    Types: 28 Cans of beer per week    Comment: Quit 07/29/20 (had wine 09/20/20)   Drug use: Not Currently    Types: Marijuana   Sexual activity: Yes    Partners: Female, Male  Other Topics Concern   Not on file  Social History Narrative   Lives with his wife. He is working part-time. He enjoys woodworking.   Social Determinants of Health   Financial Resource Strain: Low Risk  (09/26/2020)   Overall Financial Resource Strain (CARDIA)    Difficulty of Paying Living Expenses: Not hard at all  Food Insecurity: No Food Insecurity (09/26/2020)   Hunger Vital Sign    Worried About Running Out of Food in the Last Year: Never true    Ran Out of Food in the Last Year: Never true  Transportation Needs: No Transportation Needs (09/26/2020)   PRAPARE - Hydrologist (Medical): No    Lack of Transportation (Non-Medical):  No  Physical Activity: Sufficiently Active (09/26/2020)   Exercise Vital Sign    Days of Exercise per Week: 5 days    Minutes of Exercise per Session: 50 min  Stress: No Stress Concern Present (09/26/2020)   [place]    Feeling of Stress : Not at all  Social Connections: Moderately Integrated (09/26/2020)   Social Connection and Isolation Panel [NHANES]    Frequency of Communication with Friends and Family: More than three times a week    Frequency of Social Gatherings with Friends and Family: Once a week    Attends Religious Services: 1 to 4 times per year    Active Member of [employer] or Organizations: No    Attends Archivist Meetings: Never    Marital Status: Married    History reviewed. No pertinent family history.  Health Maintenance  Topic Date Due   INFLUENZA VACCINE   09/01/2021   COVID-19 Vaccine (4 - 2023-24 season) 10/02/2021   Medicare Annual Wellness (AWV)  04/01/2022 (Originally 09/26/2021)   COLONOSCOPY (Pts 45-82yrs Insurance coverage will need to be confirmed)  08/15/2022   Pneumonia Vaccine 23+ Years old (3 - PPSV23 or PCV20) 11/29/2023   DTaP/Tdap/Td (3 - Td or Tdap) 05/24/2027   Hepatitis C Screening  Completed   Zoster Vaccines- Shingrix  Completed   HPV VACCINES  Aged Out     ----------------------------------------------------------------------------------------------------------------------------------------------------------------------------------------------------------------- Physical Exam BP 115/72 (BP Location: Left Arm, Patient Position: Sitting, Cuff Size: Large)   Pulse 84   Ht 6' (1.829 m)   Wt 223 lb (101.2 kg)   SpO2 98%   BMI 30.24 kg/m   Physical Exam Constitutional:      Appearance: Normal appearance.  Eyes:     General: No scleral icterus. Cardiovascular:     Rate and Rhythm: Normal rate and regular rhythm.  Pulmonary:     Effort: Pulmonary effort is normal.     Breath sounds: Normal breath sounds.  Musculoskeletal:     Cervical back: Neck supple.  Neurological:     Mental Status: He is alert.  Psychiatric:        Mood and Affect: Mood normal.        Behavior: Behavior normal.     ------------------------------------------------------------------------------------------------------------------------------------------------------------------------------------------------------------------- Assessment and Plan  Essential hypertension Blood pressure is well-controlled.  Recommend continuation of valsartan with hydrochlorothiazide for management of hypertension.  Exposure to silica Chest x-ray ordered.  History of alcohol use disorder Remains abstinent from alcohol during the week with occasional consumption on the weekends.  Continue acamprosate.  Peripheral neuropathy Likely related to his lumbar  disc disease.  We did discuss other possibilities including vitamin deficiency and/or chronic alcohol use.  Updating A1c, B12, B1 and inflammatory markers.  Mixed hyperlipidemia Continue pravastatin.  Updated lipid panel ordered.   No orders of the defined types were placed in this encounter.   Return in about 6 months (around 09/01/2022) for HTN.    This visit occurred during the SARS-CoV-2 public health emergency.  Safety protocols were in place, including screening questions prior to the visit, additional usage of staff PPE, and extensive cleaning of exam room while observing appropriate contact time as indicated for disinfecting solutions.

## 2022-03-03 NOTE — Assessment & Plan Note (Signed)
Continue pravastatin.  Updated lipid panel ordered.

## 2022-03-05 ENCOUNTER — Other Ambulatory Visit: Payer: Self-pay | Admitting: Family Medicine

## 2022-03-05 DIAGNOSIS — R918 Other nonspecific abnormal finding of lung field: Secondary | ICD-10-CM

## 2022-03-05 DIAGNOSIS — Z7729 Contact with and (suspected ) exposure to other hazardous substances: Secondary | ICD-10-CM

## 2022-03-07 LAB — CBC WITH DIFFERENTIAL/PLATELET
Absolute Monocytes: 472 cells/uL (ref 200–950)
Basophils Absolute: 21 cells/uL (ref 0–200)
Basophils Relative: 0.5 %
Eosinophils Absolute: 8 cells/uL — ABNORMAL LOW (ref 15–500)
Eosinophils Relative: 0.2 %
HCT: 45.4 % (ref 38.5–50.0)
Hemoglobin: 15.4 g/dL (ref 13.2–17.1)
Lymphs Abs: 1164 cells/uL (ref 850–3900)
MCH: 30.6 pg (ref 27.0–33.0)
MCHC: 33.9 g/dL (ref 32.0–36.0)
MCV: 90.1 fL (ref 80.0–100.0)
MPV: 10.8 fL (ref 7.5–12.5)
Monocytes Relative: 11.5 %
Neutro Abs: 2435 cells/uL (ref 1500–7800)
Neutrophils Relative %: 59.4 %
Platelets: 195 10*3/uL (ref 140–400)
RBC: 5.04 10*6/uL (ref 4.20–5.80)
RDW: 13.5 % (ref 11.0–15.0)
Total Lymphocyte: 28.4 %
WBC: 4.1 10*3/uL (ref 3.8–10.8)

## 2022-03-07 LAB — COMPLETE METABOLIC PANEL WITH GFR
AG Ratio: 1.4 (calc) (ref 1.0–2.5)
ALT: 27 U/L (ref 9–46)
AST: 22 U/L (ref 10–35)
Albumin: 4.4 g/dL (ref 3.6–5.1)
Alkaline phosphatase (APISO): 60 U/L (ref 35–144)
BUN: 22 mg/dL (ref 7–25)
CO2: 28 mmol/L (ref 20–32)
Calcium: 10 mg/dL (ref 8.6–10.3)
Chloride: 100 mmol/L (ref 98–110)
Creat: 0.95 mg/dL (ref 0.70–1.35)
Globulin: 3.1 g/dL (calc) (ref 1.9–3.7)
Glucose, Bld: 95 mg/dL (ref 65–99)
Potassium: 4.1 mmol/L (ref 3.5–5.3)
Sodium: 137 mmol/L (ref 135–146)
Total Bilirubin: 0.5 mg/dL (ref 0.2–1.2)
Total Protein: 7.5 g/dL (ref 6.1–8.1)
eGFR: 87 mL/min/{1.73_m2} (ref >=60)

## 2022-03-07 LAB — LIPID PANEL W/REFLEX DIRECT LDL
Cholesterol: 173 mg/dL (ref <200)
HDL: 36 mg/dL — ABNORMAL LOW (ref >=40)
LDL Cholesterol (Calc): 103 mg/dL (calc) — ABNORMAL HIGH
Non-HDL Cholesterol (Calc): 137 mg/dL (calc) — ABNORMAL HIGH (ref <130)
Total CHOL/HDL Ratio: 4.8 (calc) (ref <5.0)
Triglycerides: 226 mg/dL — ABNORMAL HIGH (ref <150)

## 2022-03-07 LAB — HEMOGLOBIN A1C
Hgb A1c MFr Bld: 6 % of total Hgb — ABNORMAL HIGH (ref <5.7)
Mean Plasma Glucose: 126 mg/dL
eAG (mmol/L): 7 mmol/L

## 2022-03-07 LAB — C-REACTIVE PROTEIN: CRP: 4.7 mg/L (ref <8.0)

## 2022-03-07 LAB — VITAMIN B1: Vitamin B1 (Thiamine): <6 nmol/L — ABNORMAL LOW (ref 8–30)

## 2022-03-07 LAB — VITAMIN B12: Vitamin B-12: 446 pg/mL (ref 200–1100)

## 2022-03-07 LAB — SEDIMENTATION RATE: Sed Rate: 22 mm/h — ABNORMAL HIGH (ref 0–20)

## 2022-03-08 ENCOUNTER — Other Ambulatory Visit: Payer: Self-pay | Admitting: Family Medicine

## 2022-03-08 MED ORDER — THIAMINE HCL 100 MG PO TABS: 100.0000 mg | ORAL_TABLET | Freq: Every day | ORAL | 3 refills | Status: AC

## 2022-03-11 ENCOUNTER — Ambulatory Visit (INDEPENDENT_AMBULATORY_CARE_PROVIDER_SITE_OTHER): Payer: Medicare Other

## 2022-03-11 ENCOUNTER — Encounter: Payer: Self-pay | Admitting: Sports Medicine

## 2022-03-11 ENCOUNTER — Ambulatory Visit (INDEPENDENT_AMBULATORY_CARE_PROVIDER_SITE_OTHER): Payer: Medicare Other | Admitting: Sports Medicine

## 2022-03-11 DIAGNOSIS — R918 Other nonspecific abnormal finding of lung field: Secondary | ICD-10-CM | POA: Diagnosis not present

## 2022-03-11 DIAGNOSIS — Z7729 Contact with and (suspected ) exposure to other hazardous substances: Secondary | ICD-10-CM | POA: Diagnosis not present

## 2022-03-11 DIAGNOSIS — M1811 Unilateral primary osteoarthritis of first carpometacarpal joint, right hand: Secondary | ICD-10-CM | POA: Diagnosis not present

## 2022-03-11 DIAGNOSIS — M79642 Pain in left hand: Secondary | ICD-10-CM

## 2022-03-11 MED ORDER — DICLOFENAC SODIUM 75 MG PO TBEC: 75.0000 mg | DELAYED_RELEASE_TABLET | Freq: Two times a day (BID) | ORAL | 3 refills | Status: DC

## 2022-03-11 NOTE — Progress Notes (Signed)
    Procedures performed today:    None.  Independent interpretation of notes and tests performed by another provider:   None.  Brief History, Exam, Impression, and Recommendations:    Primary osteoarthritis of first carpometacarpal joint of right hand Classic first CMC osteoarthritis, any x-rays, Voltaren, home physical therapy, return to see me in 6 weeks, injection if no better.  Left hand pain Increasing pain left second MCP after an injury. He did have x-rays done at the time of the injury, these were negative with the exception of widespread arthritic changes.   On exam he does have a flexor tendon nodule, palpation here does reproduce his pain. I would like him directory, Voltaren as below, trigger finger conditioning, if insufficient improvement we will probably get an MRI of his left hand before considering flexor tendon sheath injection. Of note he does have NSAIDs listed as an allergy but has been able to take Celebrex without a problem.    ____________________________________________ Gwen Her. Dianah Field, M.D., ABFM., CAQSM., AME. Primary Care and Sports Medicine [facility]  Adjunct Professor of [employer]  Risk manager

## 2022-03-11 NOTE — Assessment & Plan Note (Signed)
Increasing pain left second MCP after an injury. He did have x-rays done at the time of the injury, these were negative with the exception of widespread arthritic changes.   On exam he does have a flexor tendon nodule, palpation here does reproduce his pain. I would like him directory, Voltaren as below, trigger finger conditioning, if insufficient improvement we will probably get an MRI of his left hand before considering flexor tendon sheath injection. Of note he does have NSAIDs listed as an allergy but has been able to take Celebrex without a problem.

## 2022-03-11 NOTE — Assessment & Plan Note (Signed)
Classic first [place] osteoarthritis, any x-rays, Voltaren, home physical therapy, return to see me in 6 weeks, injection if no better.

## 2022-03-12 ENCOUNTER — Other Ambulatory Visit: Payer: Self-pay | Admitting: Family Medicine

## 2022-03-12 DIAGNOSIS — I2510 Atherosclerotic heart disease of native coronary artery without angina pectoris: Secondary | ICD-10-CM

## 2022-03-15 ENCOUNTER — Telehealth: Payer: Self-pay

## 2022-03-15 NOTE — Telephone Encounter (Signed)
Referral updated to Dr. Stanford Breed -Cardiology at the [facility] office In [place].

## 2022-03-15 NOTE — Telephone Encounter (Signed)
Steven Hebert would like to go to a cardiologist in [place] or [place]. Please send the referral to a different location.

## 2022-03-16 NOTE — Telephone Encounter (Signed)
Message sent to patient via Mychart with referral change information.

## 2022-03-22 ENCOUNTER — Telehealth: Payer: Self-pay | Admitting: Family Medicine

## 2022-03-22 NOTE — Telephone Encounter (Signed)
Left voicemail for patient to call and schedule annual wellness visit at number on file. Steven Hebert

## 2022-04-02 ENCOUNTER — Other Ambulatory Visit: Payer: Self-pay | Admitting: Family Medicine

## 2022-04-22 ENCOUNTER — Ambulatory Visit (INDEPENDENT_AMBULATORY_CARE_PROVIDER_SITE_OTHER): Payer: Medicare Other | Admitting: Sports Medicine

## 2022-04-22 DIAGNOSIS — M1811 Unilateral primary osteoarthritis of first carpometacarpal joint, right hand: Secondary | ICD-10-CM | POA: Diagnosis not present

## 2022-04-22 DIAGNOSIS — M79642 Pain in left hand: Secondary | ICD-10-CM | POA: Diagnosis not present

## 2022-04-22 NOTE — Assessment & Plan Note (Signed)
Left second MCP pain resolved now.

## 2022-04-22 NOTE — Assessment & Plan Note (Signed)
For the most part resolved with diclofenac twice daily. No change in plan, though if he has recurrence of pain we can certainly consider CMC injection.

## 2022-04-22 NOTE — Progress Notes (Signed)
    Procedures performed today:    None.  Independent interpretation of notes and tests performed by another provider:   None.  Brief History, Exam, Impression, and Recommendations:    Primary osteoarthritis of first carpometacarpal joint of right hand For the most part resolved with diclofenac twice daily. No change in plan, though if he has recurrence of pain we can certainly consider CMC injection.  Left hand pain Left second MCP pain resolved now.    ____________________________________________ Gwen Her. Dianah Field, M.D., ABFM., CAQSM., AME. Primary Care and Sports Medicine [facility]  Adjunct Professor of [place] of [employer] of [employer]  Risk manager

## 2022-05-11 NOTE — Progress Notes (Signed)
Referring-Steven Jerolyn CenterMathews, DO Reason for referral-coronary calcification  HPI: 67 year old male for evaluation of coronary calcification at request of Milford CageCody Mathews, DO.  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  High-resolution chest CT February 2024 showed no evidence of fibrotic interstitial lung disease but there was note of severe coronary calcification and aortic atherosclerosis.  Cardiology now asked to evaluate.  Patient denies dyspnea, chest pain, palpitations or syncope.  No claudication.  Current Outpatient Medications  Medication Sig Dispense Refill   acamprosate (CAMPRAL) 333 MG tablet Take 333 mg by mouth in the morning and at bedtime.     ALPRAZolam (XANAX) 0.5 MG tablet Take 0.5-1 tablets (0.25-0.5 mg total) by mouth 2 (two) times daily as needed for anxiety or sleep. 30 tablet 1   amitriptyline (ELAVIL) 25 MG tablet Take 25 mg by mouth at bedtime.     aspirin EC 81 MG tablet Take 1 tablet (81 mg total) by mouth daily. Swallow whole. 90 tablet 3   diclofenac (VOLTAREN) 75 MG EC tablet Take 1 tablet (75 mg total) by mouth 2 (two) times daily. 60 tablet 3   gabapentin (NEURONTIN) 600 MG tablet Take 600 mg by mouth 4 (four) times daily.     HYDROcodone-acetaminophen (NORCO/VICODIN) 5-325 MG tablet Take 1 tablet by mouth every 4 (four) hours as needed for moderate pain (back/stenosis).     omeprazole (PRILOSEC OTC) 20 MG tablet Take 1 tablet (20 mg total) by mouth daily. 90 tablet 3   pregabalin (LYRICA) 200 MG capsule TAKE 1 CAPSULE (200 MG TOTAL) BY MOUTH IN THE MORNING, AT NOON, AND AT BEDTIME. 90 capsule 4   rosuvastatin (CRESTOR) 40 MG tablet Take 1 tablet (40 mg total) by mouth daily. 90 tablet 3   thiamine (VITAMIN B1) 100 MG tablet Take 1 tablet (100 mg total) by mouth daily. 90 tablet 3   traZODone (DESYREL) 100 MG tablet TAKE 1 TABLET BY MOUTH EVERYDAY AT BEDTIME 90 tablet 2    valsartan-hydrochlorothiazide (DIOVAN-HCT) 320-25 MG tablet Take 1 tablet by mouth daily. 90 tablet 3   No current facility-administered medications for this visit.    Allergies  Allergen Reactions   Nsaids Other (See Comments)   Penicillins Rash     Past Medical History:  Diagnosis Date   Alcohol abuse    GERD (gastroesophageal reflux disease)    H/O degenerative disc disease    High blood pressure    High cholesterol    PUD (peptic ulcer disease)     Past Surgical History:  Procedure Laterality Date   ACHILLES TENDON REPAIR     APPENDECTOMY     HERNIA REPAIR     NOSE SURGERY      Social History   Socioeconomic History   Marital status: Married    Spouse name: Verlon AuLeslie   Number of children: 2   Years of education: 14   Highest education level: Some college, no degree  Occupational History   Occupation: Pharmacist, hospitalJECT SUPERVISOR     Employer: [employer]    Comment: Part time  Tobacco Use   Smoking status: Former    Packs/day: 0.50    Years: 10.00    Additional pack years: 0.00    Total pack years: 5.00    Types: Cigarettes    Quit date: 1977    Years since quitting: 47.3   Smokeless tobacco: Never  Vaping Use   Vaping Use: Never  used  Substance and Sexual Activity   Alcohol use: Yes    Alcohol/week: 28.0 standard drinks of alcohol    Types: 28 Cans of beer per week    Comment: Quit 07/29/20 (had wine 09/20/20)   Drug use: Not Currently    Types: Marijuana   Sexual activity: Yes    Partners: Female, Male  Other Topics Concern   Not on file  Social History Narrative   Lives with his wife. He is working part-time. He enjoys woodworking.   Social Determinants of Health   Financial Resource Strain: Low Risk  (09/26/2020)   Overall Financial Resource Strain (CARDIA)    Difficulty of Paying Living Expenses: Not hard at all  Food Insecurity: No Food Insecurity (09/26/2020)   Hunger Vital Sign    Worried About Running Out of Food in the Last Year: Never  true    Ran Out of Food in the Last Year: Never true  Transportation Needs: No Transportation Needs (09/26/2020)   PRAPARE - Administrator, Civil Service (Medical): No    Lack of Transportation (Non-Medical): No  Physical Activity: Sufficiently Active (09/26/2020)   Exercise Vital Sign    Days of Exercise per Week: 5 days    Minutes of Exercise per Session: 50 min  Stress: No Stress Concern Present (09/26/2020)   [employer] of Occupational Health - Occupational Stress Questionnaire    Feeling of Stress : Not at all  Social Connections: Moderately Integrated (09/26/2020)   Social Connection and Isolation Panel [NHANES]    Frequency of Communication with Friends and Family: More than three times a week    Frequency of Social Gatherings with Friends and Family: Once a week    Attends Religious Services: 1 to 4 times per year    Active Member of [employer] or Organizations: No    Attends Banker Meetings: Never    Marital Status: Married  Catering manager Violence: Not At Risk (09/26/2020)   Humiliation, Afraid, Rape, and Kick questionnaire    Fear of Current or Ex-Partner: No    Emotionally Abused: No    Physically Abused: No    Sexually Abused: No    Family History  Problem Relation Age of Onset   Hypertension Father     ROS: no fevers or chills, productive cough, hemoptysis, dysphasia, odynophagia, melena, hematochezia, dysuria, hematuria, rash, seizure activity, orthopnea, PND, pedal edema, claudication. Remaining systems are negative.  Physical Exam:   Blood pressure (!) 128/90, pulse 81, height 5\' 11"  (1.803 m), weight 220 lb 1.9 oz (99.8 kg), SpO2 93 %.  General:  Well developed/well nourished in NAD Skin warm/dry Patient not depressed No peripheral clubbing Back-normal HEENT-normal/normal eyelids Neck supple/normal carotid upstroke bilaterally; no bruits; no JVD; no thyromegaly chest - CTA/ normal expansion CV - RRR/normal S1 and S2; no  murmurs, rubs or gallops;  PMI nondisplaced Abdomen -NT/ND, no HSM, no mass, + bowel sounds, no bruit 2+ femoral pulses, no bruits Ext-no edema, chords, 2+ DP Neuro-grossly nonfocal  ECG -normal sinus rhythm at a rate of 81, no ST changes.  Personally reviewed  A/P  1 coronary calcification-patient with severe coronary calcification by report.  Will add aspirin 81 mg daily.  We will continue statin.  He does not have exertional chest pain.  Given the severity of his coronary calcification I will arrange a stress nuclear study to screen for ischemia.  2 history of ectatic abdominal aorta-plan follow-up ultrasound January 2027.  3 hyperlipidemia-given coronary calcification  I will discontinue pravastatin and instead treat with Crestor 40 mg daily.  Check lipids and liver in 8 weeks.  Goal LDL less than 55.  4 hypertension-blood pressure mildly elevated.  He will follow this at home and we will add additional medications as needed.  Olga MillersBrian Alyvia Derk, MD

## 2022-05-17 ENCOUNTER — Encounter: Payer: Self-pay | Admitting: *Deleted

## 2022-05-17 ENCOUNTER — Ambulatory Visit (INDEPENDENT_AMBULATORY_CARE_PROVIDER_SITE_OTHER): Payer: Medicare Other | Admitting: Cardiology

## 2022-05-17 ENCOUNTER — Other Ambulatory Visit: Payer: Self-pay | Admitting: *Deleted

## 2022-05-17 ENCOUNTER — Encounter: Payer: Self-pay | Admitting: Cardiology

## 2022-05-17 VITALS — BP 128/90 | HR 81 | Ht 71.0 in | Wt 220.1 lb

## 2022-05-17 DIAGNOSIS — I10 Essential (primary) hypertension: Secondary | ICD-10-CM

## 2022-05-17 DIAGNOSIS — I2584 Coronary atherosclerosis due to calcified coronary lesion: Secondary | ICD-10-CM | POA: Diagnosis not present

## 2022-05-17 DIAGNOSIS — I2510 Atherosclerotic heart disease of native coronary artery without angina pectoris: Secondary | ICD-10-CM

## 2022-05-17 DIAGNOSIS — E7800 Pure hypercholesterolemia, unspecified: Secondary | ICD-10-CM

## 2022-05-17 MED ORDER — ASPIRIN 81 MG PO TBEC
81.0000 mg | DELAYED_RELEASE_TABLET | Freq: Every day | ORAL | 3 refills | Status: AC
Start: 2022-05-17 — End: ?

## 2022-05-17 MED ORDER — ROSUVASTATIN CALCIUM 40 MG PO TABS
40.0000 mg | ORAL_TABLET | Freq: Every day | ORAL | 3 refills | Status: DC
Start: 2022-05-17 — End: 2023-04-28

## 2022-05-17 NOTE — Patient Instructions (Signed)
Medication Instructions:   STOP PRAVASTATIN  START ROSUVASTATIN 40 MG ONCE DAILY  START ASPIRIN 81 MG ONCE DAILY  *If you need a refill on your cardiac medications before your next appointment, please call your pharmacy*   Lab Work:  Your physician recommends that you return for lab work in: 8 [facility]  If you have labs (blood work) drawn today and your tests are completely normal, you will receive your results only by: MyChart Message (if you have MyChart) OR A paper copy in the mail If you have any lab test that is abnormal or we need to change your treatment, we will call you to review the results.   Testing/Procedures:  Your physician has requested that you have en exercise stress myoview. For further information please visit [email or web address] Please follow instruction sheet, as given. [address]   Follow-Up: At [facility], you and your health needs are our priority.  As part of our continuing mission to provide you with exceptional heart care, we have created designated Provider Care Teams.  These Care Teams include your primary Cardiologist (physician) and Advanced Practice Providers (APPs -  Physician Assistants and Nurse Practitioners) who all work together to provide you with the care you need, when you need it.  We recommend signing up for the patient portal called "MyChart".  Sign up information is provided on this After Visit Summary.  MyChart is used to connect with patients for Virtual Visits (Telemedicine).  Patients are able to view lab/test results, encounter notes, upcoming appointments, etc.  Non-urgent messages can be sent to your provider as well.   To learn more about what you can do with MyChart, go to [email or web address].    Your next appointment:   6 month(s)  Provider:   Olga Millers, MD

## 2022-05-19 ENCOUNTER — Encounter (HOSPITAL_COMMUNITY): Payer: Self-pay

## 2022-05-27 ENCOUNTER — Ambulatory Visit (HOSPITAL_COMMUNITY): Payer: Medicare Other | Attending: Internal Medicine

## 2022-05-27 DIAGNOSIS — I2510 Atherosclerotic heart disease of native coronary artery without angina pectoris: Secondary | ICD-10-CM | POA: Diagnosis not present

## 2022-05-27 DIAGNOSIS — I2584 Coronary atherosclerosis due to calcified coronary lesion: Secondary | ICD-10-CM | POA: Insufficient documentation

## 2022-05-27 LAB — MYOCARDIAL PERFUSION IMAGING
Angina Index: 0
Duke Treadmill Score: 8
Estimated workload: 10.2
Exercise duration (min): 8 min
Exercise duration (sec): 0 s
LV dias vol: 84 mL (ref 62–150)
LV sys vol: 25 mL
MPHR: 151 {beats}/min
Nuc Stress EF: 70 %
Peak HR: 136 {beats}/min
Percent HR: 90 %
Rest HR: 81 {beats}/min
Rest Nuclear Isotope Dose: 10.6 mCi
SDS: 0
SRS: 0
SSS: 0
ST Depression (mm): 0 mm
Stress Nuclear Isotope Dose: 31.6 mCi
TID: 0.7

## 2022-05-27 MED ORDER — TECHNETIUM TC 99M TETROFOSMIN IV KIT
10.6000 | PACK | Freq: Once | INTRAVENOUS | Status: AC | PRN
  Administered 2022-05-27: 10.6 via INTRAVENOUS

## 2022-05-27 MED ORDER — TECHNETIUM TC 99M TETROFOSMIN IV KIT
31.6000 | PACK | Freq: Once | INTRAVENOUS | Status: AC | PRN
  Administered 2022-05-27: 31.6 via INTRAVENOUS

## 2022-07-15 ENCOUNTER — Other Ambulatory Visit: Payer: Self-pay | Admitting: Sports Medicine

## 2022-07-15 DIAGNOSIS — M79642 Pain in left hand: Secondary | ICD-10-CM

## 2022-07-29 ENCOUNTER — Telehealth: Payer: Self-pay | Admitting: *Deleted

## 2022-07-29 DIAGNOSIS — E7800 Pure hypercholesterolemia, unspecified: Secondary | ICD-10-CM

## 2022-07-29 DIAGNOSIS — I2510 Atherosclerotic heart disease of native coronary artery without angina pectoris: Secondary | ICD-10-CM

## 2022-07-29 LAB — LIPID PANEL
Chol/HDL Ratio: 4 ratio (ref 0.0–5.0)
Cholesterol, Total: 148 mg/dL (ref 100–199)
HDL: 37 mg/dL — ABNORMAL LOW
LDL Chol Calc (NIH): 86 mg/dL (ref 0–99)
Triglycerides: 139 mg/dL (ref 0–149)
VLDL Cholesterol Cal: 25 mg/dL (ref 5–40)

## 2022-07-29 LAB — HEPATIC FUNCTION PANEL
ALT: 51 IU/L — ABNORMAL HIGH (ref 0–44)
AST: 35 IU/L (ref 0–40)
Albumin: 4.5 g/dL (ref 3.9–4.9)
Alkaline Phosphatase: 72 IU/L (ref 44–121)
Bilirubin Total: 0.4 mg/dL (ref 0.0–1.2)
Bilirubin, Direct: 0.15 mg/dL (ref 0.00–0.40)
Total Protein: 7.3 g/dL (ref 6.0–8.5)

## 2022-07-29 MED ORDER — EZETIMIBE 10 MG PO TABS
10.0000 mg | ORAL_TABLET | Freq: Every day | ORAL | 3 refills | Status: DC
Start: 2022-07-29 — End: 2023-07-08

## 2022-07-29 NOTE — Telephone Encounter (Signed)
-----   Message from Lewayne Bunting, MD sent at 07/29/2022  7:13 AM EDT ----- Zetia 10 mg daily; lipids and liver 8 weeks Olga Millers

## 2022-07-29 NOTE — Telephone Encounter (Signed)
Pt has reviewed results via my chart  New script sent to the pharmacy  Lab orders mailed to the pt  

## 2022-09-03 ENCOUNTER — Ambulatory Visit: Payer: Medicare Other | Admitting: Sports Medicine

## 2022-09-07 ENCOUNTER — Ambulatory Visit (INDEPENDENT_AMBULATORY_CARE_PROVIDER_SITE_OTHER): Payer: Medicare Other | Admitting: Family Medicine

## 2022-09-07 VITALS — Ht 71.0 in | Wt 212.0 lb

## 2022-09-07 DIAGNOSIS — Z0000 Encounter for general adult medical examination without abnormal findings: Secondary | ICD-10-CM | POA: Diagnosis not present

## 2022-09-07 DIAGNOSIS — Z1211 Encounter for screening for malignant neoplasm of colon: Secondary | ICD-10-CM

## 2022-09-07 NOTE — Progress Notes (Signed)
MEDICARE ANNUAL WELLNESS VISIT  09/07/2022  Telephone Visit Disclaimer This Medicare AWV was conducted by telephone due to national recommendations for restrictions regarding the COVID-19 Pandemic (e.g. social distancing).  I verified, using two identifiers, that I am speaking with Steven Hebert or their authorized healthcare agent. I discussed the limitations, risks, security, and privacy concerns of performing an evaluation and management service by telephone and the potential availability of an in-person appointment in the future. The patient expressed understanding and agreed to proceed.  Location of Patient: Home Location of Provider (nurse):  In the office.  Subjective:    Steven Hebert is a 67 y.o. male patient of Everrett Coombe, DO who had a Medicare Annual Wellness Visit today via telephone. Nkrumah is Retired and lives with their spouse. he has 2 children. he reports that he is socially active and does interact with friends/family regularly. he is moderately physically active and enjoys woodworking.  Patient Care Team: Everrett Coombe, DO as PCP - General (Family Medicine)     09/07/2022   10:51 AM 07/30/2021   10:30 AM 09/26/2020   10:19 AM  Advanced Directives  Does Patient Have a Medical Advance Directive? Yes Yes Yes  Type of Advance Directive Living will Healthcare Power of [place];Living will Living will  Does patient want to make changes to medical advance directive? No - Patient declined No - Patient declined No - Patient declined  Copy of Healthcare Power of Attorney in Chart?  No - copy requested     Hospital Utilization Over the Past 12 Months: # of hospitalizations or ER visits: 0 # of surgeries: 0  Review of Systems    Patient reports that his overall health is better compared to last year.  History obtained from chart review and the patient  Patient Reported Readings (BP, Pulse, CBG, Weight, etc) Weight: 212 lb Height: 67f11 Unable to obtain BP and Pulse  due to lack of equipment at patient's home and due to telehealth visit.  Pain Assessment Pain : No/denies pain     Current Medications & Allergies (verified) Allergies as of 09/07/2022       Reactions   Nsaids Other (See Comments)   Penicillins Rash        Medication List        Accurate as of September 07, 2022 10:58 AM. If you have any questions, ask your nurse or doctor.          acamprosate 333 MG tablet Commonly known as: CAMPRAL Take 333 mg by mouth in the morning and at bedtime.   ALPRAZolam 0.5 MG tablet Commonly known as: XANAX Take 0.5-1 tablets (0.25-0.5 mg total) by mouth 2 (two) times daily as needed for anxiety or sleep.   amitriptyline 25 MG tablet Commonly known as: ELAVIL Take 25 mg by mouth at bedtime.   aspirin EC 81 MG tablet Take 1 tablet (81 mg total) by mouth daily. Swallow whole.   diclofenac 75 MG EC tablet Commonly known as: VOLTAREN TAKE 1 TABLET BY MOUTH TWICE A DAY   ezetimibe 10 MG tablet Commonly known as: ZETIA Take 1 tablet (10 mg total) by mouth daily.   gabapentin 600 MG tablet Commonly known as: NEURONTIN Take 600 mg by mouth 4 (four) times daily. Only takes 2 tablets at bedtime.   HYDROcodone-acetaminophen 5-325 MG tablet Commonly known as: NORCO/VICODIN Take 1 tablet by mouth every 4 (four) hours as needed for moderate pain (back/stenosis).   omeprazole 20 MG tablet Commonly  known as: PRILOSEC OTC Take 1 tablet (20 mg total) by mouth daily.   pregabalin 200 MG capsule Commonly known as: LYRICA TAKE 1 CAPSULE (200 MG TOTAL) BY MOUTH IN THE MORNING, AT NOON, AND AT BEDTIME.   rosuvastatin 40 MG tablet Commonly known as: CRESTOR Take 1 tablet (40 mg total) by mouth daily.   thiamine 100 MG tablet Commonly known as: VITAMIN B1 Take 1 tablet (100 mg total) by mouth daily.   traZODone 100 MG tablet Commonly known as: DESYREL TAKE 1 TABLET BY MOUTH EVERYDAY AT BEDTIME   valsartan-hydrochlorothiazide 320-25 MG  tablet Commonly known as: DIOVAN-HCT Take 1 tablet by mouth daily.        History (reviewed): Past Medical History:  Diagnosis Date   Alcohol abuse    Coronary artery disease    GERD (gastroesophageal reflux disease)    H/O degenerative disc disease    High blood pressure    High cholesterol    PUD (peptic ulcer disease)    Past Surgical History:  Procedure Laterality Date   ACHILLES TENDON REPAIR     APPENDECTOMY     HERNIA REPAIR     NOSE SURGERY     Family History  Problem Relation Age of Onset   Hypertension Father    Social History   Socioeconomic History   Marital status: Married    Spouse name: Verlon Au   Number of children: 2   Years of education: 14   Highest education level: Some college, no degree  Occupational History   Occupation: Pharmacist, 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  Tobacco Use   Smoking status: Former    Current packs/day: 0.00    Average packs/day: 0.5 packs/day for 10.0 years (5.0 ttl pk-yrs)    Types: Cigarettes    Start date: 37    Quit date: 1977    Years since quitting: 47.6   Smokeless tobacco: Never  Vaping Use   Vaping status: Never Used  Substance and Sexual Activity   Alcohol use: Yes    Alcohol/week: 12.0 standard drinks of alcohol    Types: 12 Cans of beer per week    Comment: Quit 07/29/20 (had wine 09/20/20)   Drug use: Not Currently    Types: Marijuana   Sexual activity: Yes    Partners: Female, Male  Other Topics Concern   Not on file  Social History Narrative   Lives with his wife. He has two children. He enjoys woodworking.   Social Determinants of Health   Financial Resource Strain: Low Risk  (09/07/2022)   Overall Financial Resource Strain (CARDIA)    Difficulty of Paying Living Expenses: Not hard at all  Food Insecurity: No Food Insecurity (09/07/2022)   Hunger Vital Sign    Worried About Running Out of Food in the Last Year: Never true    Ran Out of Food in the Last Year:  Never true  Transportation Needs: No Transportation Needs (09/07/2022)   PRAPARE - Administrator, Civil Service (Medical): No    Lack of Transportation (Non-Medical): No  Physical Activity: Sufficiently Active (09/07/2022)   Exercise Vital Sign    Days of Exercise per Week: 5 days    Minutes of Exercise per Session: 60 min  Stress: No Stress Concern Present (09/26/2020)   [employer] of Occupational Health - Occupational Stress Questionnaire    Feeling of Stress : Not at all  Social Connections: Moderately Integrated (09/26/2020)  Social Advertising account executive [NHANES]    Frequency of Communication with Friends and Family: More than three times a week    Frequency of Social Gatherings with Friends and Family: Once a week    Attends Religious Services: 1 to 4 times per year    Active Member of [employer] or Organizations: No    Attends Banker Meetings: Never    Marital Status: Married    Activities of Daily Living    09/07/2022   10:47 AM  In your present state of health, do you have any difficulty performing the following activities:  Hearing? 1  Comment some hearing in both ears; but no hearing aids  Vision? 0  Difficulty concentrating or making decisions? 1  Comment some forgetfullness  Walking or climbing stairs? 0  Dressing or bathing? 0  Doing errands, shopping? 0  Preparing Food and eating ? N  Using the Toilet? N  In the past six months, have you accidently leaked urine? N  Do you have problems with loss of bowel control? N  Managing your Medications? N  Managing your Finances? N  Housekeeping or managing your Housekeeping? N    Patient Education/ Literacy How often do you need to have someone help you when you read instructions, pamphlets, or other written materials from your doctor or pharmacy?: 1 - Never What is the last grade level you completed in school?: 2 years of community college  Exercise    Diet Patient reports  consuming 3 meals a day and 2 snack(s) a day Patient reports that his primary diet is: Regular Patient reports that she does have regular access to food.   Depression Screen    09/07/2022   10:47 AM 03/03/2022   10:29 AM 03/03/2021   10:40 AM 09/26/2020   10:18 AM 12/05/2019    1:51 PM 11/29/2018    3:17 PM 07/28/2018    8:15 AM  PHQ 2/9 Scores  PHQ - 2 Score 0 0 0 0 0 0 0  PHQ- 9 Score      2      Fall Risk    09/07/2022   10:47 AM 03/03/2022   10:29 AM 03/03/2021   10:40 AM 09/26/2020   10:18 AM 12/05/2019    1:52 PM  Fall Risk   Falls in the past year? 0 0 0 0 0  Number falls in past yr: 0 0 0 0   Injury with Fall? 0 0 0 0   Risk for fall due to : No Fall Risks No Fall Risks No Fall Risks No Fall Risks   Follow up Falls evaluation completed Falls evaluation completed Falls evaluation completed Falls evaluation completed      Objective:  Steven Hebert seemed alert and oriented and he participated appropriately during our telephone visit.  Blood Pressure Weight BMI  BP Readings from Last 3 Encounters:  05/17/22 (!) 128/90  03/03/22 115/72  09/29/21 (!) 151/89   Wt Readings from Last 3 Encounters:  09/07/22 212 lb (96.2 kg)  05/27/22 220 lb (99.8 kg)  05/17/22 220 lb 1.9 oz (99.8 kg)   BMI Readings from Last 1 Encounters:  09/07/22 29.57 kg/m    *Unable to obtain current vital signs, weight, and BMI due to telephone visit type  Hearing/Vision  Rennis Harding did not seem to have difficulty with hearing/understanding during the telephone conversation Reports that he has had a formal eye exam by an eye care professional within the past year Reports that  he has not had a formal hearing evaluation within the past year *Unable to fully assess hearing and vision during telephone visit type  Cognitive Function:    09/07/2022   10:51 AM 09/26/2020   10:23 AM  6CIT Screen  What Year? 0 points 0 points  What month? 0 points 0 points  What time? 0 points 0 points  Count back from 20 0  points 0 points  Months in reverse 0 points 0 points  Repeat phrase 0 points 0 points  Total Score 0 points 0 points   (Normal:0-7, Significant for Dysfunction: >8)  Normal Cognitive Function Screening: Yes   Immunization & Health Maintenance Record Immunization History  Administered Date(s) Administered   Fluad Quad(high Dose 65+) 11/14/2020   Influenza Split 12/17/2009, 01/05/2011, 11/11/2011, 10/25/2012, 11/03/2016, 11/01/2017, 11/24/2017   Influenza, High Dose Seasonal PF 10/12/2018   Influenza, Seasonal, Injecte, Preservative Fre 12/25/2013, 11/19/2014, 10/29/2015, 11/09/2017   Influenza,inj,Quad PF,6+ Mos 11/03/2016, 11/24/2017   Influenza-Unspecified 01/05/2011, 11/11/2011, 10/25/2012, 11/03/2016, 11/24/2017, 11/14/2019   Moderna SARS-COV2 Booster Vaccination 11/14/2020   Moderna Sars-Covid-2 Vaccination 01/03/2020   Pneumococcal Conjugate-13 11/29/2018   Pneumococcal Polysaccharide-23 04/28/2016   Tdap 04/28/2015, 05/23/2017   Unspecified SARS-COV-2 Vaccination 03/06/2019, 04/03/2019   Zoster Recombinant(Shingrix) 11/24/2017, 11/29/2018    Health Maintenance  Topic Date Due   COVID-19 Vaccine (4 - 2023-24 season) 09/23/2022 (Originally 10/02/2021)   INFLUENZA VACCINE  11/01/2022 (Originally 09/02/2022)   Colonoscopy  09/07/2023 (Originally 08/15/2022)   Medicare Annual Wellness (AWV)  09/07/2023   Pneumonia Vaccine 64+ Years old (3 of 3 - PPSV23 or PCV20) 11/29/2023   DTaP/Tdap/Td (3 - Td or Tdap) 05/24/2027   Hepatitis C Screening  Completed   Zoster Vaccines- Shingrix  Completed   HPV VACCINES  Aged Out       Assessment  This is a routine wellness examination for [employer].  Health Maintenance: Due or Overdue There are no preventive care reminders to display for this patient.   Steven Hebert does not need a referral for [employer] Assistance: Care Management:   no Social Work:    no Prescription Assistance:  no Nutrition/Diabetes Education:  no   Plan:   Personalized Goals  Goals Addressed               This Visit's Progress     Patient Stated (pt-stated)        Patient stated that he would like to loose 10-15 lbs.       Personalized Health Maintenance & Screening Recommendations  Influenza vaccine Colorectal cancer screening  Lung Cancer Screening Recommended: no (Low Dose CT Chest recommended if Age 53-80 years, 20 pack-year currently smoking OR have quit w/in past 15 years) Hepatitis C Screening recommended: no HIV Screening recommended: no  Advanced Directives: Written information was not prepared per patient's request.  Referrals & Orders Orders Placed This Encounter  Procedures   Ambulatory referral to Gastroenterology (for Colonoscopy)    Follow-up Plan Follow-up with Everrett Coombe, DO as planned Please call digestive health specialist to make an appointment. Medicare wellness visit in one year.  Patient will access AVS on my chart.   I have personally reviewed and noted the following in the patient's chart:   Medical and social history Use of alcohol, tobacco or illicit drugs  Current medications and supplements Functional ability and status Nutritional status Physical activity Advanced directives List of other physicians Hospitalizations, surgeries, and ER visits in previous 12 months Vitals Screenings to include cognitive, depression, and  falls Referrals and appointments  In addition, I have reviewed and discussed with Steven Hebert certain preventive protocols, quality metrics, and best practice recommendations. A written personalized care plan for preventive services as well as general preventive health recommendations is available and can be mailed to the patient at his request.      [place] Charon, RN BSN  09/07/2022

## 2022-09-07 NOTE — Patient Instructions (Addendum)
  MEDICARE ANNUAL WELLNESS VISIT Health Maintenance Summary and Written Plan of Care  Steven Hebert ,  Thank you for allowing me to perform your Medicare Annual Wellness Visit and for your ongoing commitment to your health.   Health Maintenance & Immunization History Health Maintenance  Topic Date Due   COVID-19 Vaccine (4 - 2023-24 season) 09/23/2022 (Originally 10/02/2021)   INFLUENZA VACCINE  11/01/2022 (Originally 09/02/2022)   Colonoscopy  09/07/2023 (Originally 08/15/2022)   Medicare Annual Wellness (AWV)  09/07/2023   Pneumonia Vaccine 27+ Years old (3 of 3 - PPSV23 or PCV20) 11/29/2023   DTaP/Tdap/Td (3 - Td or Tdap) 05/24/2027   Hepatitis C Screening  Completed   Zoster Vaccines- Shingrix  Completed   HPV VACCINES  Aged Out   Immunization History  Administered Date(s) Administered   Fluad Quad(high Dose 65+) 11/14/2020   Influenza Split 12/17/2009, 01/05/2011, 11/11/2011, 10/25/2012, 11/03/2016, 11/01/2017, 11/24/2017   Influenza, High Dose Seasonal PF 10/12/2018   Influenza, Seasonal, Injecte, Preservative Fre 12/25/2013, 11/19/2014, 10/29/2015, 11/09/2017   Influenza,inj,Quad PF,6+ Mos 11/03/2016, 11/24/2017   Influenza-Unspecified 01/05/2011, 11/11/2011, 10/25/2012, 11/03/2016, 11/24/2017, 11/14/2019   Moderna SARS-COV2 Booster Vaccination 11/14/2020   Moderna Sars-Covid-2 Vaccination 01/03/2020   Pneumococcal Conjugate-13 11/29/2018   Pneumococcal Polysaccharide-23 04/28/2016   Tdap 04/28/2015, 05/23/2017   Unspecified SARS-COV-2 Vaccination 03/06/2019, 04/03/2019   Zoster Recombinant(Shingrix) 11/24/2017, 11/29/2018    These are the patient goals that we discussed:  Goals Addressed               This Visit's Progress     Patient Stated (pt-stated)        Patient stated that he would like to loose 10-15 lbs.         This is a list of Health Maintenance Items that are overdue or due now: Influenza vaccine Colorectal cancer screening  Orders/Referrals Placed  Today: Orders Placed This Encounter  Procedures   Ambulatory referral to Gastroenterology (for Colonoscopy)    Referral Priority:   Routine    Referral Type:   Consultation    Referral Reason:   Specialty Services Required    Number of Visits Requested:   1   (Contact our referral department at [phone] if you have not spoken with someone about your referral appointment within the next 5 days)    Follow-up Plan Follow-up with Everrett Coombe, DO as planned Please call digestive health specialist to make an appointment. Medicare wellness visit in one year.  Patient will access AVS on my chart.

## 2022-09-13 ENCOUNTER — Ambulatory Visit (INDEPENDENT_AMBULATORY_CARE_PROVIDER_SITE_OTHER): Payer: Medicare Other | Admitting: Sports Medicine

## 2022-09-13 ENCOUNTER — Ambulatory Visit: Payer: Medicare Other | Admitting: Sports Medicine

## 2022-09-13 ENCOUNTER — Other Ambulatory Visit (INDEPENDENT_AMBULATORY_CARE_PROVIDER_SITE_OTHER): Payer: Medicare Other

## 2022-09-13 DIAGNOSIS — M1811 Unilateral primary osteoarthritis of first carpometacarpal joint, right hand: Secondary | ICD-10-CM

## 2022-09-13 NOTE — Assessment & Plan Note (Signed)
Pleasant 67 year old male, historically had good relief of his pain with diclofenac twice daily, unfortunately having recurrence of pain thumb basal joint particular when trying to grip and open jars. On exam he does have tenderness directly over the joint. Today we did a CMC injection with ultrasound guidance, continue diclofenac as needed, return as needed, we may be doing his left side in the future.

## 2022-09-13 NOTE — Progress Notes (Signed)
    Procedures performed today:    Procedure: Real-time Ultrasound Guided injection of the right first [facility] Device: Samsung HS60  Verbal informed consent obtained.  Time-out conducted.  Noted no overlying erythema, induration, or other signs of local infection.  Skin prepped in a sterile fashion.  Local anesthesia: Topical Ethyl chloride.  With sterile technique and under real time ultrasound guidance: Arthritic joint noted, 0.5 cc kenalog 40, 0.5 cc lidocaine injected easily. Completed without difficulty  Advised to call if fevers/chills, erythema, induration, drainage, or persistent bleeding.  Images permanently stored and available for review in PACS.  Impression: Technically successful ultrasound guided injection.  Independent interpretation of notes and tests performed by another provider:   None.  Brief History, Exam, Impression, and Recommendations:    Primary osteoarthritis of first carpometacarpal joint of right hand Pleasant 67 year old male, historically had good relief of his pain with diclofenac twice daily, unfortunately having recurrence of pain thumb basal joint particular when trying to grip and open jars. On exam he does have tenderness directly over the joint. Today we did a CMC injection with ultrasound guidance, continue diclofenac as needed, return as needed, we may be doing his left side in the future.    ____________________________________________ Ihor Austin. Benjamin Stain, M.D., ABFM., CAQSM., AME. Primary Care and Sports Medicine [facility]  Adjunct Professor of Family Medicine  [place] of [facility] of Medicine  Restaurant manager, fast food

## 2022-09-27 ENCOUNTER — Other Ambulatory Visit: Payer: Self-pay | Admitting: Family Medicine

## 2022-10-23 ENCOUNTER — Other Ambulatory Visit: Payer: Self-pay | Admitting: Family Medicine

## 2022-10-25 ENCOUNTER — Ambulatory Visit (INDEPENDENT_AMBULATORY_CARE_PROVIDER_SITE_OTHER): Payer: Medicare Other | Admitting: Sports Medicine

## 2022-10-25 ENCOUNTER — Encounter: Payer: Self-pay | Admitting: Sports Medicine

## 2022-10-25 DIAGNOSIS — M1811 Unilateral primary osteoarthritis of first carpometacarpal joint, right hand: Secondary | ICD-10-CM

## 2022-10-25 NOTE — Assessment & Plan Note (Signed)
[name] returns, he is a very pleasant 67 year old male, he has bilateral first CMC osteoarthritis worst on the right, we did an injection about a month ago, unfortunately he did not respond. He continues diclofenac twice daily, I have suggested a thumb loop, getting more consistent with his home exercises. He will do this for the next several months and if insufficient improvement and it has been at least 3 months we can do another injection, if not we will consider surgical consultation.

## 2022-10-25 NOTE — Progress Notes (Signed)
    Procedures performed today:    None.  Independent interpretation of notes and tests performed by another provider:   None.  Brief History, Exam, Impression, and Recommendations:    Primary osteoarthritis of first carpometacarpal joint of right hand Steven Hebert returns, he is a very pleasant 67 year old male, he has bilateral first CMC osteoarthritis worst on the right, we did an injection about a month ago, unfortunately he did not respond. He continues diclofenac twice daily, I have suggested a thumb loop, getting more consistent with his home exercises. He will do this for the next several months and if insufficient improvement and it has been at least 3 months we can do another injection, if not we will consider surgical consultation.    ____________________________________________ Steven Hebert. Benjamin Stain, M.D., ABFM., CAQSM., AME. Primary Care and Sports Medicine [facility]  Adjunct Professor of Family Medicine  [place] of [facility] of Medicine  Restaurant manager, fast food

## 2022-11-11 ENCOUNTER — Encounter: Payer: Self-pay | Admitting: Family Medicine

## 2022-11-11 ENCOUNTER — Other Ambulatory Visit: Payer: Self-pay | Admitting: Family Medicine

## 2022-11-11 ENCOUNTER — Ambulatory Visit (INDEPENDENT_AMBULATORY_CARE_PROVIDER_SITE_OTHER): Payer: Medicare Other | Admitting: Family Medicine

## 2022-11-11 VITALS — BP 124/76 | HR 72 | Ht 71.0 in | Wt 212.0 lb

## 2022-11-11 DIAGNOSIS — I10 Essential (primary) hypertension: Secondary | ICD-10-CM | POA: Diagnosis not present

## 2022-11-11 DIAGNOSIS — E782 Mixed hyperlipidemia: Secondary | ICD-10-CM

## 2022-11-11 DIAGNOSIS — I2510 Atherosclerotic heart disease of native coronary artery without angina pectoris: Secondary | ICD-10-CM | POA: Insufficient documentation

## 2022-11-11 DIAGNOSIS — G6289 Other specified polyneuropathies: Secondary | ICD-10-CM

## 2022-11-11 DIAGNOSIS — K449 Diaphragmatic hernia without obstruction or gangrene: Secondary | ICD-10-CM | POA: Diagnosis not present

## 2022-11-11 DIAGNOSIS — R7303 Prediabetes: Secondary | ICD-10-CM | POA: Diagnosis not present

## 2022-11-11 MED ORDER — HYDROCODONE-ACETAMINOPHEN 5-325 MG PO TABS: 1.0000 | ORAL_TABLET | ORAL | 0 refills | Status: DC | PRN

## 2022-11-11 MED ORDER — SEMAGLUTIDE-WEIGHT MANAGEMENT 0.5 MG/0.5ML ~~LOC~~ SOAJ: 0.5000 mg | SUBCUTANEOUS | 0 refills | Status: AC

## 2022-11-11 MED ORDER — SEMAGLUTIDE-WEIGHT MANAGEMENT 0.25 MG/0.5ML ~~LOC~~ SOAJ: 0.2500 mg | SUBCUTANEOUS | 0 refills | Status: AC

## 2022-11-11 MED ORDER — SEMAGLUTIDE-WEIGHT MANAGEMENT 1 MG/0.5ML ~~LOC~~ SOAJ
1.0000 mg | SUBCUTANEOUS | 0 refills | Status: AC
Start: 2023-01-08 — End: 2023-02-05

## 2022-11-11 NOTE — Progress Notes (Signed)
ABDULAHI SCHOR - 67 y.o. male MRN [id number]  Date of birth: 01/05/1956  Subjective Chief Complaint  Patient presents with   Annual Exam    HPI MARY HOCKEY is a 67 y.o. male here today for follow-up.  He has had some difficulty with neuropathy.  Seen by neurology previouslly and treated with gabapentin which made him very sleepy.  He has enrolled in a program utilizing supplements, vibratory plate and electrodes and electrolyte bath to help with his neuropathy.  He feels like this may be is helping some.    Followed by cardiology for coronary artery disease.  This was noted on CT of the chest.  He denies anginal symptoms.  Managing 81 mg aspirin and Crestor.  He has questions about possibly adding semaglutide as well to help with weight management and reduction of cardiovascular risk.  Blood pressure has remained well-controlled with valsartan/hydrochlorothiazide.  No side effects to current strength.  ROS:  A comprehensive ROS was completed and negative except as noted per HPI  Allergies  Allergen Reactions   Nsaids Other (See Comments)   Penicillins Rash    Past Medical History:  Diagnosis Date   Alcohol abuse    Coronary artery disease    GERD (gastroesophageal reflux disease)    H/O degenerative disc disease    High blood pressure    High cholesterol    PUD (peptic ulcer disease)     Past Surgical History:  Procedure Laterality Date   ACHILLES TENDON REPAIR     APPENDECTOMY     HERNIA REPAIR     NOSE SURGERY      Social History   Socioeconomic History   Marital status: Married    Spouse name: Verlon Au   Number of children: 2   Years of education: 14   Highest education level: Some college, no degree  Occupational History   Occupation: Pharmacist, 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  Tobacco Use   Smoking status: Former    Current packs/day: 0.00    Average packs/day: 0.5 packs/day for 10.0 years (5.0 ttl pk-yrs)    Types:  Cigarettes    Start date: 17    Quit date: 1977    Years since quitting: 47.8   Smokeless tobacco: Never  Vaping Use   Vaping status: Never Used  Substance and Sexual Activity   Alcohol use: Yes    Alcohol/week: 12.0 standard drinks of alcohol    Types: 12 Cans of beer per week    Comment: Quit 07/29/20 (had wine 09/20/20)   Drug use: Not Currently    Types: Marijuana   Sexual activity: Yes    Partners: Female, Male  Other Topics Concern   Not on file  Social History Narrative   Lives with his wife. He has two children. He enjoys woodworking.   Social Determinants of Health   Financial Resource Strain: Low Risk  (09/07/2022)   Overall Financial Resource Strain (CARDIA)    Difficulty of Paying Living Expenses: Not hard at all  Food Insecurity: No Food Insecurity (09/07/2022)   Hunger Vital Sign    Worried About Running Out of Food in the Last Year: Never true    Ran Out of Food in the Last Year: Never true  Transportation Needs: No Transportation Needs (09/07/2022)   PRAPARE - Administrator, Civil Service (Medical): No    Lack of Transportation (Non-Medical): No  Physical Activity: Sufficiently Active (09/07/2022)  Exercise Vital Sign    Days of Exercise per Week: 5 days    Minutes of Exercise per Session: 60 min  Stress: No Stress Concern Present (09/26/2020)   [employer] of Occupational Health - Occupational Stress Questionnaire    Feeling of Stress : Not at all  Social Connections: Unknown (10/05/2022)   Received from [facility]   Social Network    Social Network: Not on file    Family History  Problem Relation Age of Onset   Hypertension Father     Health Maintenance  Topic Date Due   Colonoscopy  09/07/2023 (Originally 08/15/2022)   COVID-19 Vaccine (6 - 2023-24 season) 11/10/2023 (Originally 12/09/2022)   Medicare Annual Wellness (AWV)  09/07/2023   Pneumonia Vaccine 58+ Years old (3 of 3 - PPSV23 or PCV20) 11/29/2023   DTaP/Tdap/Td (3 - Td or  Tdap) 05/24/2027   INFLUENZA VACCINE  Completed   Hepatitis C Screening  Completed   Zoster Vaccines- Shingrix  Completed   HPV VACCINES  Aged Out     ----------------------------------------------------------------------------------------------------------------------------------------------------------------------------------------------------------------- Physical Exam BP 124/76 (BP Location: Left Arm, Patient Position: Sitting, Cuff Size: Large)   Pulse 72   Ht 5\' 11"  (1.803 m)   Wt 212 lb (96.2 kg)   SpO2 98%   BMI 29.57 kg/m   Physical Exam Constitutional:      Appearance: Normal appearance.  HENT:     Head: Normocephalic and atraumatic.  Eyes:     General: No scleral icterus. Cardiovascular:     Rate and Rhythm: Normal rate and regular rhythm.  Pulmonary:     Effort: Pulmonary effort is normal.     Breath sounds: Normal breath sounds.  Neurological:     Mental Status: He is alert.  Psychiatric:        Mood and Affect: Mood normal.        Behavior: Behavior normal.     ------------------------------------------------------------------------------------------------------------------------------------------------------------------------------------------------------------------- Assessment and Plan  Essential hypertension Blood pressure is well-controlled.  Recommend continuation of valsartan with hydrochlorothiazide for management of hypertension.  Mixed hyperlipidemia Noted to have significant coronary artery disease on previous CT scan.  Remains on rosuvastatin.  Updating lipid and LFTs.  Prediabetes Updated A1c ordered.  Peripheral neuropathy Likely related to his lumbar disc disease.  We did discuss other possibilities including vitamin deficiency and/or chronic alcohol use.  He is using a program combining multiple treatment modalities.  Coronary artery disease involving native coronary artery of native heart without angina pectoris Medical management  with aggressive lipid control, 81 mg aspirin.  Will also add Wegovy.   Meds ordered this encounter  Medications   Semaglutide-Weight Management 0.25 MG/0.5ML SOAJ    Sig: Inject 0.25 mg into the skin once a week for 28 days.    Dispense:  2 mL    Refill:  0   Semaglutide-Weight Management 0.5 MG/0.5ML SOAJ    Sig: Inject 0.5 mg into the skin once a week for 28 days.    Dispense:  2 mL    Refill:  0   Semaglutide-Weight Management 1 MG/0.5ML SOAJ    Sig: Inject 1 mg into the skin once a week for 28 days.    Dispense:  2 mL    Refill:  0   HYDROcodone-acetaminophen (NORCO/VICODIN) 5-325 MG tablet    Sig: Take 1 tablet by mouth every 4 (four) hours as needed for moderate pain (back/stenosis).    Dispense:  30 tablet    Refill:  0    No follow-ups  on file.    This visit occurred during the SARS-CoV-2 public health emergency.  Safety protocols were in place, including screening questions prior to the visit, additional usage of staff PPE, and extensive cleaning of exam room while observing appropriate contact time as indicated for disinfecting solutions.

## 2022-11-11 NOTE — Assessment & Plan Note (Signed)
Blood pressure is well-controlled.  Recommend continuation of valsartan with hydrochlorothiazide for management of hypertension.

## 2022-11-11 NOTE — Assessment & Plan Note (Signed)
Updated A1c ordered. ?

## 2022-11-11 NOTE — Assessment & Plan Note (Addendum)
Likely related to his lumbar disc disease.  We did discuss other possibilities including vitamin deficiency and/or chronic alcohol use.  He is using a program combining multiple treatment modalities.

## 2022-11-11 NOTE — Assessment & Plan Note (Signed)
Noted to have significant coronary artery disease on previous CT scan.  Remains on rosuvastatin.  Updating lipid and LFTs.

## 2022-11-11 NOTE — Assessment & Plan Note (Signed)
Medical management with aggressive lipid control, 81 mg aspirin.  Will also add Wegovy.

## 2022-11-12 LAB — CMP14+EGFR
ALT: 53 [IU]/L — ABNORMAL HIGH (ref 0–44)
AST: 33 [IU]/L (ref 0–40)
Albumin: 4.5 g/dL (ref 3.9–4.9)
Alkaline Phosphatase: 71 [IU]/L (ref 44–121)
BUN/Creatinine Ratio: 21 (ref 10–24)
BUN: 22 mg/dL (ref 8–27)
Bilirubin Total: 0.6 mg/dL (ref 0.0–1.2)
CO2: 23 mmol/L (ref 20–29)
Calcium: 9.6 mg/dL (ref 8.6–10.2)
Chloride: 100 mmol/L (ref 96–106)
Creatinine, Ser: 1.04 mg/dL (ref 0.76–1.27)
Globulin, Total: 2.6 g/dL (ref 1.5–4.5)
Glucose: 100 mg/dL — ABNORMAL HIGH (ref 70–99)
Potassium: 4 mmol/L (ref 3.5–5.2)
Sodium: 138 mmol/L (ref 134–144)
Total Protein: 7.1 g/dL (ref 6.0–8.5)
eGFR: 78 mL/min/{1.73_m2} (ref >59)

## 2022-11-12 LAB — CBC WITH DIFFERENTIAL/PLATELET
Basophils Absolute: 0 10*3/uL (ref 0.0–0.2)
Basos: 1 %
EOS (ABSOLUTE): 0.2 10*3/uL (ref 0.0–0.4)
Eos: 5 %
Hematocrit: 43.3 % (ref 37.5–51.0)
Hemoglobin: 13.9 g/dL (ref 13.0–17.7)
Immature Grans (Abs): 0 10*3/uL (ref 0.0–0.1)
Immature Granulocytes: 0 %
Lymphocytes Absolute: 1.6 10*3/uL (ref 0.7–3.1)
Lymphs: 39 %
MCH: 31.3 pg (ref 26.6–33.0)
MCHC: 32.1 g/dL (ref 31.5–35.7)
MCV: 98 fL — ABNORMAL HIGH (ref 79–97)
Monocytes Absolute: 0.6 10*3/uL (ref 0.1–0.9)
Monocytes: 14 %
Neutrophils Absolute: 1.7 10*3/uL (ref 1.4–7.0)
Neutrophils: 41 %
Platelets: 189 10*3/uL (ref 150–450)
RBC: 4.44 x10E6/uL (ref 4.14–5.80)
RDW: 13.2 % (ref 11.6–15.4)
WBC: 4.1 10*3/uL (ref 3.4–10.8)

## 2022-11-12 LAB — LIPID PANEL WITH LDL/HDL RATIO
Cholesterol, Total: 132 mg/dL (ref 100–199)
HDL: 40 mg/dL (ref >39)
LDL Chol Calc (NIH): 71 mg/dL (ref 0–99)
LDL/HDL Ratio: 1.8 {ratio} (ref 0.0–3.6)
Triglycerides: 115 mg/dL (ref 0–149)
VLDL Cholesterol Cal: 21 mg/dL (ref 5–40)

## 2022-11-12 LAB — HEMOGLOBIN A1C
Est. average glucose Bld gHb Est-mCnc: 123 mg/dL
Hgb A1c MFr Bld: 5.9 % — ABNORMAL HIGH (ref 4.8–5.6)

## 2022-11-23 ENCOUNTER — Encounter: Payer: Self-pay | Admitting: Sports Medicine

## 2022-11-23 ENCOUNTER — Ambulatory Visit (INDEPENDENT_AMBULATORY_CARE_PROVIDER_SITE_OTHER): Payer: Medicare Other | Admitting: Sports Medicine

## 2022-11-23 DIAGNOSIS — G5603 Carpal tunnel syndrome, bilateral upper limbs: Secondary | ICD-10-CM | POA: Insufficient documentation

## 2022-11-23 NOTE — Progress Notes (Signed)
    Procedures performed today:    None.  Independent interpretation of notes and tests performed by another provider:   None.  Brief History, Exam, Impression, and Recommendations:    Carpal tunnel syndrome on both sides Pleasant 68 year old male, he does have a long history of bilateral hand pain, numbness and tingling, both at night and when gripping the steering well. In the distant past he worked with [facility], he had a nerve conduction and EMG that did confirm bilateral carpal tunnel syndrome. He is not wearing his splints. We will start conservative care including a new set of nocturnal cock up beanbag splints, he will get these off of [employer], his current splints are not comfortable. We will also do carpal tunnel home physical therapy and he will dramatically cut out the sodium in his diet, return to see me in 6 weeks, bilateral median nerve hydrodissections if not better.    ____________________________________________ Ihor Austin. Benjamin Stain, M.D., ABFM., CAQSM., AME. Primary Care and Sports Medicine [facility]  Adjunct Professor of Family Medicine  [place] of [facility] of Medicine  Restaurant manager, fast food

## 2022-11-23 NOTE — Assessment & Plan Note (Signed)
Pleasant 67 year old male, he does have a long history of bilateral hand pain, numbness and tingling, both at night and when gripping the steering well. In the distant past he worked with [facility], he had a nerve conduction and EMG that did confirm bilateral carpal tunnel syndrome. He is not wearing his splints. We will start conservative care including a new set of nocturnal cock up beanbag splints, he will get these off of [employer], his current splints are not comfortable. We will also do carpal tunnel home physical therapy and he will dramatically cut out the sodium in his diet, return to see me in 6 weeks, bilateral median nerve hydrodissections if not better.

## 2022-11-28 ENCOUNTER — Other Ambulatory Visit: Payer: Self-pay | Admitting: Sports Medicine

## 2022-11-28 DIAGNOSIS — M79642 Pain in left hand: Secondary | ICD-10-CM

## 2022-12-29 LAB — HM COLONOSCOPY

## 2023-01-01 IMAGING — DX DG FINGER RING 2+V*L*
3 series · 3 of 3 positions shown · non-contrast
Comparison: None Available.

CLINICAL DATA: Question bony mallet. Mallet deformity of left
finger.

EXAM:
LEFT RING FINGER 2+V

[finger ap]
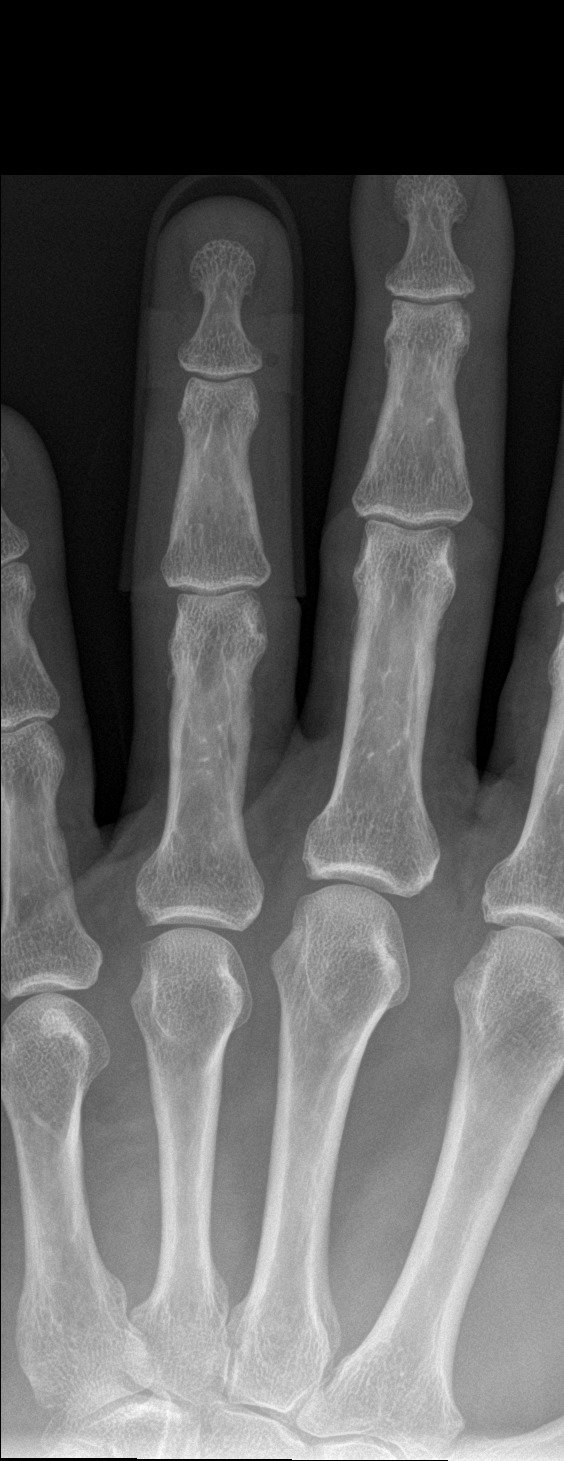

[finger obl]
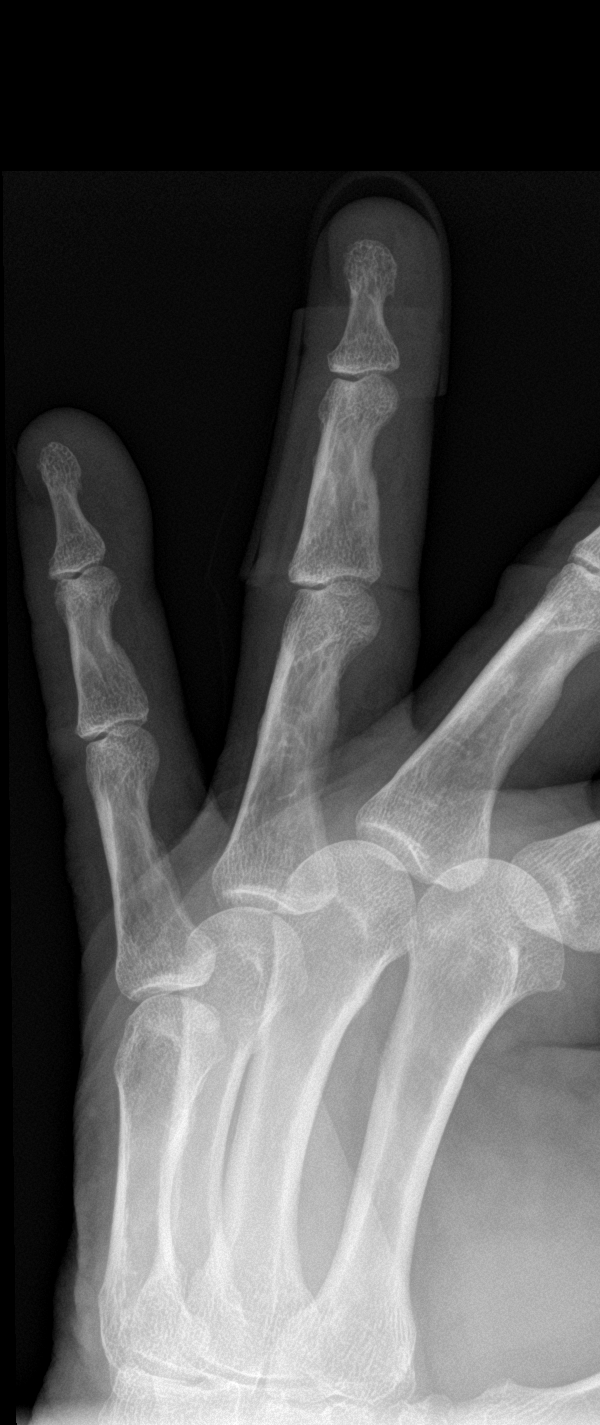

[finger lat]
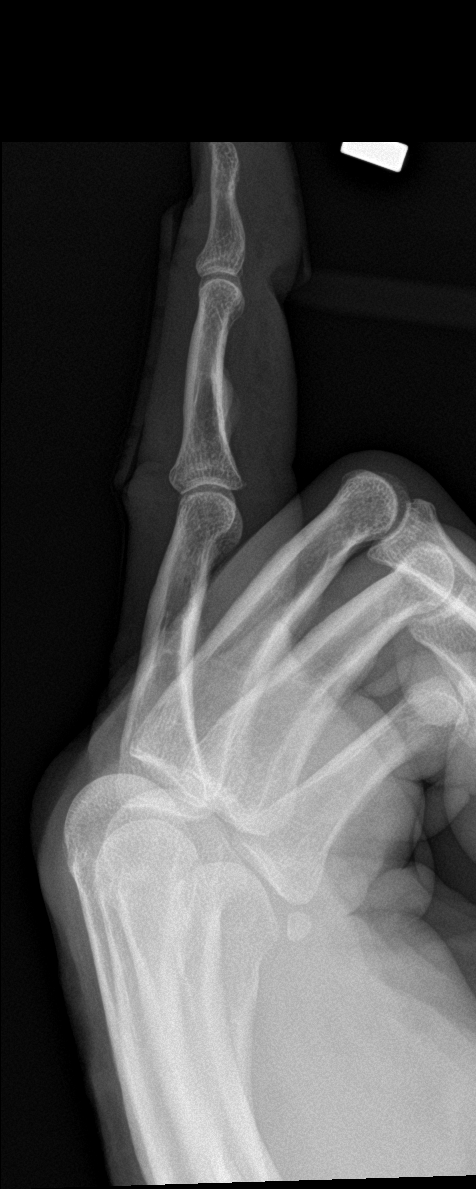

[3 of 3 positions shown; findings below may reference images not displayed]

FINDINGS: There is no evidence of fracture or dislocation. Slight
straightening of the digit. There is no evidence of arthropathy or
other focal bone abnormality. Soft tissues are unremarkable.
Overlying splint in place.
IMPRESSION: No fracture or dislocation. Slight straightening of the digit.

## 2023-01-04 ENCOUNTER — Encounter: Payer: Self-pay | Admitting: Sports Medicine

## 2023-01-04 ENCOUNTER — Ambulatory Visit (INDEPENDENT_AMBULATORY_CARE_PROVIDER_SITE_OTHER): Payer: Medicare Other | Admitting: Sports Medicine

## 2023-01-04 DIAGNOSIS — G5603 Carpal tunnel syndrome, bilateral upper limbs: Secondary | ICD-10-CM

## 2023-01-04 DIAGNOSIS — M1811 Unilateral primary osteoarthritis of first carpometacarpal joint, right hand: Secondary | ICD-10-CM

## 2023-01-04 NOTE — Patient Instructions (Signed)
Platelet-rich plasma is used in musculoskeletal medicine to focus your own body's ability to heal. It has several well-done published randomized control trials (RCT) which demonstrate both its effectiveness and safety in many musculoskeletal conditions, including osteoarthritis, tendinopathies, and damaged vertebral discs. PRP has been in clinical use since the 1990's. Many people know that platelets form a clot if there is a cut in the skin. It turns out that platelets do not only form a clot, they also start the body's own repair process. When platelets activate to form a clot, they also release alpha granules which have hundreds of chemical messengers in them that initiate and organize repair to the damaged tissue. Precisely placing PRP into the site of injury will initiate the healing process by activating on the damaged cartilage or tendon. This is an inflammatory process, and inflammation is the vital first phase of Healing.  What to expect and how to prepare for PRP   2 weeks prior to the procedure: depending on the procedure, you may need to arrange for a driver to bring you home. IF you are having a lower extremity procedure, we can provide crutches as needed.   7 days prior to the procedure: Stop taking anti-inflammatory drugs like ibuprofen, Naprosyn, Celebrex, or Meloxicam. Let Dr. Dianah Field know if you have been taking prednisone or other corticosteroids in the last month.   The day before the procedure: thoroughly shower and clean your skin.    The day of the procedure: Wear loose-fitting clothing like sweatpants or shorts. If you are having an upper body procedure wear a top that can button or zip up.  PRP will initiate healing and a productive inflammation, and PRP therapy will make the body part treated sore for 4 days to two weeks. Anti-inflammatory drugs (i.e. ibuprofen, Naprosyn, Celebrex) and corticosteroids such as prednisone can blunt or stop this process,  so it is important to not take any anti-inflammatory drugs for 7 days before getting PRP therapy, or for at least three weeks after PRP therapy. Corticosteroid injections can blunt inflammation for 30 days, so let us know if you have had one recently. Depending on the body part injected, you may be in a sling or on crutches for several days. Just like wringing out a wet dishcloth, if you load or tense a tendon or ligament that has just been injected with PRP, some of the PRP injected will squish out. By keeping the body part treated relaxed by using a sling (for the shoulder or arm) or crutches (for hips and legs) for a few days, the PRP can bind in place and do its job.   You may need a driver to bring you home.  Tobacco/nicotine is a potent toxin and its use constricts small blood vessels which are needed for tissue repair.  Tobacco/nicotine use will limit the effectiveness of any treatment and stopping tobacco use is one of the single  greatest actions you can take to improve your health. Avoid toxins like alcohol, which inhibits and depresses the cells needed for tissue repair.  What happens during the PRP procedure?  Platelet rich plasma is made by taking some of your blood and performing a two-stage centrifuge process on it to concentrate the PRP. First, your blood is drawn into a syringe with a small amount of anti-coagulant in it (this is to keep the blood from clotting during this process). The amount of blood drawn is usually about 10-30 milliliters, depending on how much PRP is needed for  the treatment.  (There are 355 milliliters in a 12-ounce soda can for comparison).  Then the blood is transferred in a sterile fashion into a centrifuge tube. It is then centrifuged for the first cycle where the red blood cells are isolated and discarded. In the second centrifuge cycle, the platelet-rich fraction of the remaining plasma is concentrated and placed in a syringe. The skin at the  injection site is numbed with a small amount of topical cooling spray. Dr Dianah Field will then precisely inject the PRP into the injury site using ultrasound guidance.  What to do after your procedure  I will give you specific medicine to control any discomfort you may have after the procedure. Avoid NSAIDs like ibuprofen. Acetaminophen can be used for mild pain.  Depending on the part of the body treated, usually you will be placed in a sling or on crutches for 1 to 3 days. Do your best not to tense or load the treated area during this time. After 3 days, unless otherwise instructed, the treated body part should be used and slowly moved through its full range of motion. It will be sore, but you will not be doing damage by moving it, in fact it needs to move to heal. If you were on crutches for a period of time, walking is ok once you are off the crutches. For now, avoid activities that specifically hurt you before being treated. Exercise is vital to good health and finding a way to cross train around your injury is important not only for your physical health, but for your mental health as well. Ask me about cross training options for your injury. Some brief (10 minutes or less) period of heat or ice therapy will not hurt the therapy, but it is not required. Usually, depending on the initial injury, physical therapy is started from two weeks to four weeks after injection. Improvements in pain and function should be expected from 8 weeks to 12 weeks after injection and some injuries may require more than one treatment.    ____________________________________________ Gwen Her. Dianah Field, M.D., ABFM., CAQSM., AME. Primary Care and Sports Medicine [facility]  Adjunct Professor of [place] of [facility] of Medicine  Risk manager

## 2023-01-04 NOTE — Progress Notes (Signed)
    Procedures performed today:    None.  Independent interpretation of notes and tests performed by another provider:   None.  Brief History, Exam, Impression, and Recommendations:    Carpal tunnel syndrome on both sides Pleasant 67 year old male, EMG and nerve conduction study confirmed bilateral carpal tunnel syndrome, he was not wearing his splints, we did set him up with a new set of nocturnal cock up beanbag splints and his carpal tunnel symptoms have resolved. He does continue with home physical therapy.  Primary osteoarthritis of first carpometacarpal joint of right hand Worsening of pain right first carpometacarpal joint, he is wearing a CMC brace that does seem to help a bit. He does diclofenac twice a day. He is not interested in a steroid injection, we did an injection earlier in the summer and he did not respond. I have advised topical Voltaren, continued oral anti-inflammatories, continued home PT. If all of this fails we can consider PRP injection, surgery would be the only additional option after that. He was also interested in [facility] [employer], I told him to check with them on their pricing for PRP and if our pricing was better than I would be happy to do it.  He does understand that it is not insurance covered.    ____________________________________________ Steven Hebert. Steven Hebert, M.D., ABFM., CAQSM., AME. Primary Care and Sports Medicine [facility]  Adjunct Professor of Family Medicine  [place] of [facility] of Medicine  Restaurant manager, fast food

## 2023-01-04 NOTE — Assessment & Plan Note (Signed)
Pleasant 67 year old male, EMG and nerve conduction study confirmed bilateral carpal tunnel syndrome, he was not wearing his splints, we did set him up with a new set of nocturnal cock up beanbag splints and his carpal tunnel symptoms have resolved. He does continue with home physical therapy.

## 2023-01-04 NOTE — Assessment & Plan Note (Signed)
Worsening of pain right first carpometacarpal joint, he is wearing a CMC brace that does seem to help a bit. He does diclofenac twice a day. He is not interested in a steroid injection, we did an injection earlier in the summer and he did not respond. I have advised topical Voltaren, continued oral anti-inflammatories, continued home PT. If all of this fails we can consider PRP injection, surgery would be the only additional option after that. He was also interested in [facility] [employer], I told him to check with them on their pricing for PRP and if our pricing was better than I would be happy to do it.  He does understand that it is not insurance covered.

## 2023-01-17 ENCOUNTER — Other Ambulatory Visit: Payer: Self-pay | Admitting: Family Medicine

## 2023-02-22 ENCOUNTER — Ambulatory Visit: Payer: Self-pay | Admitting: Family Medicine

## 2023-02-22 NOTE — Telephone Encounter (Signed)
Chief Complaint: dizziness Symptoms: lightheaded when changing position Frequency: several weeks Pertinent Negatives: Patient denies LOC, CP, SOB Disposition: [] ED /[] Urgent Care (no appt availability in office) / [x] Appointment(In office/virtual)/ []  [facility]/ [] Home Care/ [] Refused Recommended Disposition /[] [facility]/ []  Follow-up with PCP Additional Notes: Patient called reporting lightheadedness during workouts when changing positions, specifically when bending over to change weights and standing back up since the beginning of January. Reports it is significant lightheadedness, but resolves almost immediately. He also reports that back in Nov he twisted his R knee and is still having pain and would like that evaluated as well. Per protocol, patient to be evaluated within 24 hours. Offered patient an appt for tomorrow with PCP , patient declined. Requests appt for Thursday. Scheduled patient for 02/24/23 at 1310. Care advice reviewed with patient, understanding verbalized. Denies further questions at this time. Alerting PCP for review.    Copied from CRM [id number]. Topic: Clinical - Red Word Triage >> Feb 22, 2023  3:45 PM Alvino Blood C wrote: Red Word that prompted transfer to Nurse Triage: Frequent dizzy spells and feeling light headed at times/ injured right MCL in November and has not healed patient is still having pain. Reason for Disposition  [1] MODERATE dizziness (e.g., interferes with normal activities) AND [2] has NOT been evaluated by doctor (or NP/PA) for this  (Exception: Dizziness caused by heat exposure, sudden standing, or poor fluid intake.)  Answer Assessment - Initial Assessment Questions 1. DESCRIPTION: "Describe your dizziness."     When bending over during workouts patient reports lightheadedness.  2. LIGHTHEADED: "Do you feel lightheaded?" (e.g., somewhat faint, woozy, weak upon standing)     Yes during workouts, more frequently than normal. 3.  VERTIGO: "Do you feel like either you or the room is spinning or tilting?" (i.e. vertigo)     Denies, has had vertigo but does not feel the same. 4. SEVERITY: "How bad is it?"  "Do you feel like you are going to faint?" "Can you stand and walk?"   - MILD: Feels slightly dizzy, but walking normally.   - MODERATE: Feels unsteady when walking, but not falling; interferes with normal activities (e.g., school, work).   - SEVERE: Unable to walk without falling, or requires assistance to walk without falling; feels like passing out now.      Moderate with standing, but then resolves almost immediately. 5. ONSET:  "When did the dizziness begin?"     Noticed increasing beginning of January.  6. AGGRAVATING FACTORS: "Does anything make it worse?" (e.g., standing, change in head position)     Changing position 7. HEART RATE: "Can you tell me your heart rate?" "How many beats in 15 seconds?"  (Note: not all patients can do this)       Cannot determine 8. CAUSE: "What do you think is causing the dizziness?"     Unsure 9. RECURRENT SYMPTOM: "Have you had dizziness before?" If Yes, ask: "When was the last time?" "What happened that time?"     Denies 10. OTHER SYMPTOMS: "Do you have any other symptoms?" (e.g., fever, chest pain, vomiting, diarrhea, bleeding)       Denies  Answer Assessment - Initial Assessment Questions 1. LOCATION and RADIATION: "Where is the pain located?"      R knee, inside and towards the front 2. QUALITY: "What does the pain feel like?"  (e.g., sharp, dull, aching, burning)     Sharp with movement, unable to bend all the way down without  stinging. 3. SEVERITY: "How bad is the pain?" "What does it keep you from doing?"   (Scale 1-10; or mild, moderate, severe)   -  MILD (1-3): doesn't interfere with normal activities    -  MODERATE (4-7): interferes with normal activities (e.g., work or school) or awakens from sleep, limping    -  SEVERE (8-10): excruciating pain, unable to do any  normal activities, unable to walk     0/10 4. ONSET: "When did the pain start?" "Does it come and go, or is it there all the time?"     Injured in November, worsening since. 5. RECURRENT: "Have you had this pain before?" If Yes, ask: "When, and what happened then?"     Denies 6. SETTING: "Has there been any recent work, exercise or other activity that involved that part of the body?"      Nothing abnormal- States he slipped on the steps and twisted in Novemnber 7. AGGRAVATING FACTORS: "What makes the knee pain worse?" (e.g., walking, climbing stairs, running)     Bending and trying to stand with that leg first 8. ASSOCIATED SYMPTOMS: "Is there any swelling or redness of the knee?"     Denies at this time 9. OTHER SYMPTOMS: "Do you have any other symptoms?" (e.g., chest pain, difficulty breathing, fever, calf pain)     Denies  Protocols used: Dizziness - Lightheadedness-A-AH, Knee Pain-A-AH

## 2023-02-24 ENCOUNTER — Encounter: Payer: Self-pay | Admitting: Family Medicine

## 2023-02-24 ENCOUNTER — Ambulatory Visit (INDEPENDENT_AMBULATORY_CARE_PROVIDER_SITE_OTHER): Payer: Medicare Other | Admitting: Family Medicine

## 2023-02-24 VITALS — BP 103/71 | HR 92 | Ht 71.0 in | Wt 221.0 lb

## 2023-02-24 DIAGNOSIS — I10 Essential (primary) hypertension: Secondary | ICD-10-CM | POA: Diagnosis not present

## 2023-02-24 DIAGNOSIS — M25561 Pain in right knee: Secondary | ICD-10-CM

## 2023-02-24 DIAGNOSIS — R7303 Prediabetes: Secondary | ICD-10-CM

## 2023-02-24 DIAGNOSIS — F419 Anxiety disorder, unspecified: Secondary | ICD-10-CM

## 2023-02-24 DIAGNOSIS — I2510 Atherosclerotic heart disease of native coronary artery without angina pectoris: Secondary | ICD-10-CM

## 2023-02-24 MED ORDER — WEGOVY 0.25 MG/0.5ML ~~LOC~~ SOAJ: 0.2500 mg | SUBCUTANEOUS | 0 refills | Status: DC

## 2023-02-24 MED ORDER — ALPRAZOLAM 0.5 MG PO TABS: 0.5000 mg | ORAL_TABLET | Freq: Two times a day (BID) | ORAL | 0 refills | Status: DC | PRN

## 2023-02-24 MED ORDER — WEGOVY 0.5 MG/0.5ML ~~LOC~~ SOAJ: 0.5000 mg | SUBCUTANEOUS | 0 refills | Status: AC

## 2023-02-24 MED ORDER — WEGOVY 1 MG/0.5ML ~~LOC~~ SOAJ: 1.0000 mg | SUBCUTANEOUS | 3 refills | Status: DC

## 2023-02-24 NOTE — Assessment & Plan Note (Signed)
Pain along MCL.  Given handout for HEP.  He will let me know if no improvement after  a few weeks.

## 2023-02-24 NOTE — Assessment & Plan Note (Signed)
Alprazolam renewed 

## 2023-02-24 NOTE — Assessment & Plan Note (Signed)
Medical management with aggressive lipid control, 81 mg aspirin.  Will also add Wegovy for risk factor management.

## 2023-02-24 NOTE — Progress Notes (Signed)
Steven Hebert - 68 y.o. male MRN [id number]  Date of birth: 1955-11-01  Subjective Chief Complaint  Patient presents with   Dizziness   Knee Injury    HPI Steven Hebert is a 68 y.o. male here today with complaint of knee pain and feelings of lightheadedness.   He has had feelings of lightheadedness, especially with positional change such as bending over or standing too quickly.  His BP is on the lower end of normal today.  He has not had chest pain, shortness of breath, palpitations, headache or vision changes.  He has not had any falls related to this.   He has also had some pain in the R knee.  This started a couple of months ago after falling and twisting his knee  Pain is located on the medial knee and is sharp at times.  He did note swelling of the knee initially but this has resolved at this time.   ROS:  A comprehensive ROS was completed and negative except as noted per HPI  Allergies  Allergen Reactions   Nsaids Other (See Comments)   Penicillins Rash    Past Medical History:  Diagnosis Date   Alcohol abuse    Coronary artery disease    GERD (gastroesophageal reflux disease)    H/O degenerative disc disease    High blood pressure    High cholesterol    PUD (peptic ulcer disease)     Past Surgical History:  Procedure Laterality Date   ACHILLES TENDON REPAIR     APPENDECTOMY     HERNIA REPAIR     NOSE SURGERY      Social History   Socioeconomic History   Marital status: Married    Spouse name: Verlon Au   Number of children: 2   Years of education: 14   Highest education level: Some college, no degree  Occupational History   Occupation: Pharmacist, 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  Tobacco Use   Smoking status: Former    Current packs/day: 0.00    Average packs/day: 0.5 packs/day for 10.0 years (5.0 ttl pk-yrs)    Types: Cigarettes    Start date: 22    Quit date: 1977    Years since quitting: 48.0   Smokeless tobacco:  Never  Vaping Use   Vaping status: Never Used  Substance and Sexual Activity   Alcohol use: Yes    Alcohol/week: 12.0 standard drinks of alcohol    Types: 12 Cans of beer per week    Comment: Quit 07/29/20 (had wine 09/20/20)   Drug use: Not Currently    Types: Marijuana   Sexual activity: Yes    Partners: Female, Male  Other Topics Concern   Not on file  Social History Narrative   Lives with his wife. He has two children. He enjoys woodworking.   Social Drivers of Corporate investment banker Strain: Low Risk  (09/07/2022)   Overall Financial Resource Strain (CARDIA)    Difficulty of Paying Living Expenses: Not hard at all  Food Insecurity: No Food Insecurity (09/07/2022)   Hunger Vital Sign    Worried About Running Out of Food in the Last Year: Never true    Ran Out of Food in the Last Year: Never true  Transportation Needs: No Transportation Needs (09/07/2022)   PRAPARE - Administrator, Civil Service (Medical): No    Lack of Transportation (Non-Medical): No  Physical Activity: Sufficiently Active (  09/07/2022)   Exercise Vital Sign    Days of Exercise per Week: 5 days    Minutes of Exercise per Session: 60 min  Stress: No Stress Concern Present (09/26/2020)   [employer] of Occupational Health - Occupational Stress Questionnaire    Feeling of Stress : Not at all  Social Connections: Unknown (10/05/2022)   Received from [facility]   Social Network    Social Network: Not on file    Family History  Problem Relation Age of Onset   Hypertension Father     Health Maintenance  Topic Date Due   Pneumonia Vaccine 43+ Years old (3 of 3 - PPSV23 or PCV20) 04/28/2021   COVID-19 Vaccine (6 - 2024-25 season) 11/10/2023 (Originally 12/09/2022)   Medicare Annual Wellness (AWV)  09/07/2023   DTaP/Tdap/Td (3 - Td or Tdap) 05/24/2027   Colonoscopy  12/28/2032   INFLUENZA VACCINE  Completed   Hepatitis C Screening  Completed   Zoster Vaccines- Shingrix  Completed   HPV  VACCINES  Aged Out     ----------------------------------------------------------------------------------------------------------------------------------------------------------------------------------------------------------------- Physical Exam BP 103/71 (BP Location: Left Arm, Patient Position: Sitting, Cuff Size: Large)   Pulse 92   Ht 5\' 11"  (1.803 m)   Wt 221 lb (100.2 kg)   SpO2 97%   BMI 30.82 kg/m   Physical Exam Constitutional:      Appearance: Normal appearance.  HENT:     Head: Normocephalic and atraumatic.  Cardiovascular:     Rate and Rhythm: Normal rate and regular rhythm.  Pulmonary:     Effort: Pulmonary effort is normal.     Breath sounds: Normal breath sounds.  Musculoskeletal:     Cervical back: Neck supple.  Neurological:     General: No focal deficit present.     Mental Status: He is alert.  Psychiatric:        Mood and Affect: Mood normal.        Behavior: Behavior normal.     ------------------------------------------------------------------------------------------------------------------------------------------------------------------------------------------------------------------- Assessment and Plan  Anxiety Alprazolam renewed.   Coronary artery disease involving native coronary artery of native heart without angina pectoris Medical management with aggressive lipid control, 81 mg aspirin.  Will also add Wegovy for risk factor management.   Essential hypertension BP is running low and he is having some orthostasis.  Reduct valsartan/hydrochlorothiazide to 1/2 tab daily.   Right knee pain Pain along MCL.  Given handout for HEP.  He will let me know if no improvement after  a few weeks.    Meds ordered this encounter  Medications   ALPRAZolam (XANAX) 0.5 MG tablet    Sig: Take 1 tablet (0.5 mg total) by mouth 2 (two) times daily as needed for anxiety.    Dispense:  30 tablet    Refill:  0    This request is for a new prescription for a  controlled substance as required by Federal/State law.   Semaglutide-Weight Management (WEGOVY) 0.25 MG/0.5ML SOAJ    Sig: Inject 0.25 mg into the skin once a week. Increase to 0.5mg  after 4 weeks.    Dispense:  2 mL    Refill:  0   Semaglutide-Weight Management (WEGOVY) 0.5 MG/0.5ML SOAJ    Sig: Inject 0.5 mg into the skin once a week. Increase to 1mg  after 4 weeks    Dispense:  2 mL    Refill:  0   Semaglutide-Weight Management (WEGOVY) 1 MG/0.5ML SOAJ    Sig: Inject 1 mg into the skin once a week.  Dispense:  2 mL    Refill:  3    No follow-ups on file.    This visit occurred during the SARS-CoV-2 public health emergency.  Safety protocols were in place, including screening questions prior to the visit, additional usage of staff PPE, and extensive cleaning of exam room while observing appropriate contact time as indicated for disinfecting solutions.

## 2023-02-24 NOTE — Patient Instructions (Signed)
Reduce valsartan/hydrochlorothiazide to 1/2 tab daily.

## 2023-02-24 NOTE — Assessment & Plan Note (Signed)
BP is running low and he is having some orthostasis.  Reduct valsartan/hydrochlorothiazide to 1/2 tab daily.

## 2023-03-08 ENCOUNTER — Ambulatory Visit: Payer: Medicare Other | Admitting: Family Medicine

## 2023-04-27 ENCOUNTER — Other Ambulatory Visit: Payer: Self-pay | Admitting: Sports Medicine

## 2023-04-27 ENCOUNTER — Other Ambulatory Visit: Payer: Self-pay | Admitting: Cardiology

## 2023-04-27 DIAGNOSIS — M79642 Pain in left hand: Secondary | ICD-10-CM

## 2023-04-27 DIAGNOSIS — E7800 Pure hypercholesterolemia, unspecified: Secondary | ICD-10-CM

## 2023-04-28 ENCOUNTER — Other Ambulatory Visit: Payer: Self-pay | Admitting: Family Medicine

## 2023-04-28 NOTE — Telephone Encounter (Signed)
 Unsure if Rx should be changed to accommodate OV note directive:   Essential hypertension BP is running low and he is having some orthostasis.  Reduct valsartan/hydrochlorothiazide to 1/2 tab daily.

## 2023-07-04 ENCOUNTER — Other Ambulatory Visit: Payer: Self-pay | Admitting: Family Medicine

## 2023-07-08 ENCOUNTER — Other Ambulatory Visit: Payer: Self-pay | Admitting: Cardiology

## 2023-07-08 DIAGNOSIS — I2510 Atherosclerotic heart disease of native coronary artery without angina pectoris: Secondary | ICD-10-CM

## 2023-07-08 DIAGNOSIS — E7800 Pure hypercholesterolemia, unspecified: Secondary | ICD-10-CM

## 2023-07-21 NOTE — Progress Notes (Unsigned)
 HPI: Follow-up coronary calcification. 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  High-resolution chest CT February 2024 showed no evidence of fibrotic interstitial lung disease but there was note of severe coronary calcification and aortic atherosclerosis.  Nuclear study April 2024 showed ejection fraction 70% and normal perfusion.  Since last seen  Current Outpatient Medications  Medication Sig Dispense Refill   acamprosate (CAMPRAL) 333 MG tablet Take 333 mg by mouth in the morning and at bedtime.     ALPRAZolam  (XANAX ) 0.5 MG tablet Take 1 tablet (0.5 mg total) by mouth 2 (two) times daily as needed for anxiety. 30 tablet 0   amitriptyline (ELAVIL) 25 MG tablet Take 25 mg by mouth at bedtime.     aspirin  EC 81 MG tablet Take 1 tablet (81 mg total) by mouth daily. Swallow whole. 90 tablet 3   diclofenac  (VOLTAREN ) 75 MG EC tablet TAKE 1 TABLET BY MOUTH TWICE A DAY 60 tablet 3   ezetimibe  (ZETIA ) 10 MG tablet TAKE 1 TABLET BY MOUTH EVERY DAY 30 tablet 0   HYDROcodone -acetaminophen  (NORCO/VICODIN) 5-325 MG tablet Take 1 tablet by mouth every 4 (four) hours as needed for moderate pain (back/stenosis). 30 tablet 0   omeprazole  (PRILOSEC  OTC) 20 MG tablet Take 1 tablet (20 mg total) by mouth daily. 90 tablet 3   rosuvastatin  (CRESTOR ) 40 MG tablet TAKE 1 TABLET BY MOUTH EVERY DAY 90 tablet 0   Semaglutide -Weight Management (WEGOVY ) 0.25 MG/0.5ML SOAJ Inject 0.25 mg into the skin once a week. Increase to 0.5mg  after 4 weeks. 2 mL 0   Semaglutide -Weight Management (WEGOVY ) 0.5 MG/0.5ML SOAJ Inject 0.5 mg into the skin once a week. Increase to 1mg  after 4 weeks 2 mL 0   Semaglutide -Weight Management (WEGOVY ) 1 MG/0.5ML SOAJ Inject 1 mg into the skin once a week. 2 mL 3   thiamine  (VITAMIN B1) 100 MG tablet Take 1 tablet (100 mg total) by mouth daily. 90 tablet 3   traZODone  (DESYREL ) 100 MG tablet TAKE 1  TABLET BY MOUTH EVERYDAY AT BEDTIME 90 tablet 1   valsartan -hydrochlorothiazide  (DIOVAN -HCT) 320-25 MG tablet Take 0.5 tablets by mouth daily. 45 tablet 1   No current facility-administered medications for this visit.     Past Medical History:  Diagnosis Date   Alcohol abuse    Coronary artery disease    GERD (gastroesophageal reflux disease)    H/O degenerative disc disease    High blood pressure    High cholesterol    PUD (peptic ulcer disease)     Past Surgical History:  Procedure Laterality Date   ACHILLES TENDON REPAIR     APPENDECTOMY     HERNIA REPAIR     NOSE SURGERY      Social History   Socioeconomic History   Marital status: Married    Spouse name: Gurney Lefort   Number of children: 2   Years of education: 14   Highest education level: Some college, no degree  Occupational History   Occupation: Pharmacist, 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  Tobacco Use   Smoking status: Former    Current packs/day: 0.00    Average packs/day: 0.5 packs/day for 10.0 years (5.0 ttl pk-yrs)    Types: Cigarettes    Start date: 18    Quit date: 1977    Years since quitting: 48.4   Smokeless tobacco: Never  Vaping  Use   Vaping status: Never Used  Substance and Sexual Activity   Alcohol use: Yes    Alcohol/week: 12.0 standard drinks of alcohol    Types: 12 Cans of beer per week    Comment: Quit 07/29/20 (had wine 09/20/20)   Drug use: Not Currently    Types: Marijuana   Sexual activity: Yes    Partners: Female, Male  Other Topics Concern   Not on file  Social History Narrative   Lives with his wife. He has two children. He enjoys woodworking.   Social Drivers of Corporate investment banker Strain: Low Risk  (09/07/2022)   Overall Financial Resource Strain (CARDIA)    Difficulty of Paying Living Expenses: Not hard at all  Food Insecurity: No Food Insecurity (09/07/2022)   Hunger Vital Sign    Worried About Running Out of Food in the Last Year:  Never true    Ran Out of Food in the Last Year: Never true  Transportation Needs: No Transportation Needs (09/07/2022)   PRAPARE - Administrator, Civil Service (Medical): No    Lack of Transportation (Non-Medical): No  Physical Activity: Sufficiently Active (09/07/2022)   Exercise Vital Sign    Days of Exercise per Week: 5 days    Minutes of Exercise per Session: 60 min  Stress: No Stress Concern Present (09/26/2020)   [employer] of Occupational Health - Occupational Stress Questionnaire    Feeling of Stress : Not at all  Social Connections: Unknown (10/05/2022)   Received from [facility]   Social Network    Social Network: Not on file  Intimate Partner Violence: Not At Risk (12/29/2022)   Received from [facility]   HITS    Over the last 12 months how often did your partner physically hurt you?: Never    Over the last 12 months how often did your partner insult you or talk down to you?: Never    Over the last 12 months how often did your partner threaten you with physical harm?: Never    Over the last 12 months how often did your partner scream or curse at you?: Never    Family History  Problem Relation Age of Onset   Hypertension Father     ROS: no fevers or chills, productive cough, hemoptysis, dysphasia, odynophagia, melena, hematochezia, dysuria, hematuria, rash, seizure activity, orthopnea, PND, pedal edema, claudication. Remaining systems are negative.  Physical Exam: Well-developed well-nourished in no acute distress.  Skin is warm and dry.  HEENT is normal.  Neck is supple.  Chest is clear to auscultation with normal expansion.  Cardiovascular exam is regular rate and rhythm.  Abdominal exam nontender or distended. No masses palpated. Extremities show no edema. neuro grossly intact  ECG- personally reviewed  A/P  1 coronary calcification-patient denies chest pain and follow-up nuclear study showed no ischemia.  Plan medical therapy.  Continue  aspirin  and statin.  2 hyperlipidemia-continue Crestor  at present dose.  Check lipids and liver.  3 hypertension-blood pressure controlled.  Continue present medical regimen.  4 ectatic abdominal aorta-patient will need follow-up ultrasound January 2027.  Alexandria Angel, MD

## 2023-07-23 ENCOUNTER — Other Ambulatory Visit: Payer: Self-pay | Admitting: Cardiology

## 2023-07-23 DIAGNOSIS — E7800 Pure hypercholesterolemia, unspecified: Secondary | ICD-10-CM

## 2023-07-27 ENCOUNTER — Encounter: Payer: Self-pay | Admitting: Cardiology

## 2023-07-27 ENCOUNTER — Ambulatory Visit (INDEPENDENT_AMBULATORY_CARE_PROVIDER_SITE_OTHER): Payer: Medicare Other | Admitting: Cardiology

## 2023-07-27 VITALS — BP 117/76 | HR 95 | Ht 71.0 in | Wt 204.0 lb

## 2023-07-27 DIAGNOSIS — R9431 Abnormal electrocardiogram [ECG] [EKG]: Secondary | ICD-10-CM | POA: Diagnosis not present

## 2023-07-27 DIAGNOSIS — I2510 Atherosclerotic heart disease of native coronary artery without angina pectoris: Secondary | ICD-10-CM | POA: Diagnosis not present

## 2023-07-27 DIAGNOSIS — I10 Essential (primary) hypertension: Secondary | ICD-10-CM | POA: Diagnosis not present

## 2023-07-27 DIAGNOSIS — E7800 Pure hypercholesterolemia, unspecified: Secondary | ICD-10-CM | POA: Diagnosis not present

## 2023-07-27 NOTE — Patient Instructions (Signed)
 Medication Instructions:   NO CHANGE  *If you need a refill on your cardiac medications before your next appointment, please call your pharmacy*  Lab Work:  Your physician recommends that you return for lab work FASTING  If you have labs (blood work) drawn today and your tests are completely normal, you will receive your results only by: MyChart Message (if you have MyChart) OR A paper copy in the mail If you have any lab test that is abnormal or we need to change your treatment, we will call you to review the results.  Testing/Procedures:  Your physician has requested that you have an echocardiogram. Echocardiography is a painless test that uses sound waves to create images of your heart. It provides your doctor with information about the size and shape of your heart and how well your heart's chambers and valves are working. This procedure takes approximately one hour. There are no restrictions for this procedure. Please do NOT wear cologne, perfume, aftershave, or lotions (deodorant is allowed). Please arrive 15 minutes prior to your appointment time.  Please note: We ask at that you not bring children with you during ultrasound (echo/ vascular) testing. Due to room size and safety concerns, children are not allowed in the ultrasound rooms during exams. Our front office staff cannot provide observation of children in our lobby area while testing is being conducted. An adult accompanying a patient to their appointment will only be allowed in the ultrasound room at the discretion of the ultrasound technician under special circumstances. We apologize for any inconvenience. [facility] 1ST FLOOR IMAGING DEPARTMENT  Follow-Up: At [facility], you and your health needs are our priority.  As part of our continuing mission to provide you with exceptional heart care, our providers are all part of one team.  This team includes your primary Cardiologist (physician) and Advanced  Practice Providers or APPs (Physician Assistants and Nurse Practitioners) who all work together to provide you with the care you need, when you need it.  Your next appointment:   12 month(s)  Provider:   Redell Shallow, MD

## 2023-08-24 ENCOUNTER — Other Ambulatory Visit: Payer: Self-pay | Admitting: Cardiology

## 2023-08-24 ENCOUNTER — Encounter: Payer: Self-pay | Admitting: Family Medicine

## 2023-08-24 ENCOUNTER — Ambulatory Visit (INDEPENDENT_AMBULATORY_CARE_PROVIDER_SITE_OTHER): Admitting: Family Medicine

## 2023-08-24 VITALS — BP 136/81 | HR 80 | Ht 71.0 in | Wt 203.0 lb

## 2023-08-24 DIAGNOSIS — E7800 Pure hypercholesterolemia, unspecified: Secondary | ICD-10-CM

## 2023-08-24 DIAGNOSIS — I2510 Atherosclerotic heart disease of native coronary artery without angina pectoris: Secondary | ICD-10-CM

## 2023-08-24 DIAGNOSIS — G252 Other specified forms of tremor: Secondary | ICD-10-CM | POA: Insufficient documentation

## 2023-08-24 DIAGNOSIS — I10 Essential (primary) hypertension: Secondary | ICD-10-CM | POA: Diagnosis not present

## 2023-08-24 MED ORDER — VALSARTAN-HYDROCHLOROTHIAZIDE 80-12.5 MG PO TABS: 1.0000 | ORAL_TABLET | Freq: Every day | ORAL | 3 refills | Status: AC

## 2023-08-24 MED ORDER — ALPRAZOLAM 0.5 MG PO TABS: 0.5000 mg | ORAL_TABLET | Freq: Two times a day (BID) | ORAL | 0 refills | Status: AC | PRN

## 2023-08-24 NOTE — Progress Notes (Signed)
 Steven Hebert - 68 y.o. male MRN [id number]  Date of birth: 1955/07/14  Subjective Chief Complaint  Patient presents with   Foot Pain   Tremors   Nevus   Dizziness    HPI Steven Hebert is a 68 y.o. male here today with complaint of dizziness.  He has hd lightheadedness and dizziness over the past several weeks.  Held his BP medication and felt better.  Checked his BP and noted that it was high so he restarted BP medications today.  He feels ok today.  He denies chest pain, shortness of breath, palpitations.   He is having R heel pain.  Has had this for a few weeks.  Does not recall any injury.  Notices tremor at rest.  No family history of parkinsonism.  No changes to gait or cognition.  ROS:  A comprehensive ROS was completed and negative except as noted per HPI     Past Medical History:  Diagnosis Date   Alcohol abuse    Coronary artery disease    GERD (gastroesophageal reflux disease)    H/O degenerative disc disease    High blood pressure    High cholesterol    PUD (peptic ulcer disease)     Past Surgical History:  Procedure Laterality Date   ACHILLES TENDON REPAIR     APPENDECTOMY     HERNIA REPAIR     NOSE SURGERY      Social History   Socioeconomic History   Marital status: Married    Spouse name: Sonny   Number of children: 2   Years of education: 14   Highest education level: Some college, no degree  Occupational History   Occupation: Pharmacist, 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  Tobacco Use   Smoking status: Former    Current packs/day: 0.00    Average packs/day: 0.5 packs/day for 10.0 years (5.0 ttl pk-yrs)    Types: Cigarettes    Start date: 36    Quit date: 1977    Years since quitting: 48.5   Smokeless tobacco: Never  Vaping Use   Vaping status: Never Used  Substance and Sexual Activity   Alcohol use: Yes    Alcohol/week: 12.0 standard drinks of alcohol    Types: 12 Cans of beer per week    Comment: Quit  07/29/20 (had wine 09/20/20)   Drug use: Not Currently    Types: Marijuana   Sexual activity: Yes    Partners: Female, Male  Other Topics Concern   Not on file  Social History Narrative   Lives with his wife. He has two children. He enjoys woodworking.   Social Drivers of Corporate investment banker Strain: Low Risk  (08/24/2023)   Overall Financial Resource Strain (CARDIA)    Difficulty of Paying Living Expenses: Not hard at all  Food Insecurity: No Food Insecurity (08/24/2023)   Hunger Vital Sign    Worried About Running Out of Food in the Last Year: Never true    Ran Out of Food in the Last Year: Never true  Transportation Needs: No Transportation Needs (08/24/2023)   PRAPARE - Administrator, Civil Service (Medical): No    Lack of Transportation (Non-Medical): No  Physical Activity: Sufficiently Active (08/24/2023)   Exercise Vital Sign    Days of Exercise per Week: 5 days    Minutes of Exercise per Session: 60 min  Stress: No Stress Concern Present (08/24/2023)  [employer] of Occupational Health - Occupational Stress Questionnaire    Feeling of Stress: Not at all  Social Connections: Moderately Integrated (08/24/2023)   Social Connection and Isolation Panel    Frequency of Communication with Friends and Family: Three times a week    Frequency of Social Gatherings with Friends and Family: Once a week    Attends Religious Services: 1 to 4 times per year    Active Member of [employer] or Organizations: No    Attends Engineer, structural: Never    Marital Status: Married    Family History  Problem Relation Age of Onset   Hypertension Father     Health Maintenance  Topic Date Due   Hepatitis B Vaccines (1 of 3 - Risk 3-dose series) Never done   Medicare Annual Wellness (AWV)  09/07/2023   COVID-19 Vaccine (6 - 2024-25 season) 11/10/2023 (Originally 12/09/2022)   INFLUENZA VACCINE  09/02/2023   Pneumococcal Vaccine: 50+ Years (3 of 3 - PCV20 or PCV21)  11/29/2023   DTaP/Tdap/Td (3 - Td or Tdap) 05/24/2027   Colonoscopy  12/28/2032   Hepatitis C Screening  Completed   Zoster Vaccines- Shingrix   Completed   HPV VACCINES  Aged Out   Meningococcal B Vaccine  Aged Out     ----------------------------------------------------------------------------------------------------------------------------------------------------------------------------------------------------------------- Physical Exam BP 136/81 (BP Location: Left Arm, Patient Position: Sitting, Cuff Size: Large)   Pulse 80   Ht 5' 11 (1.803 m)   Wt 203 lb (92.1 kg)   SpO2 98%   BMI 28.31 kg/m   Physical Exam Constitutional:      Appearance: Normal appearance.  HENT:     Head: Normocephalic and atraumatic.  Cardiovascular:     Rate and Rhythm: Normal rate and regular rhythm.  Pulmonary:     Effort: Pulmonary effort is normal.     Breath sounds: Normal breath sounds.  Neurological:     General: No focal deficit present.     Mental Status: He is alert.     Comments: Tremor noted at rest.  Psychiatric:        Mood and Affect: Mood normal.        Behavior: Behavior normal.     ------------------------------------------------------------------------------------------------------------------------------------------------------------------------------------------------------------------- Assessment and Plan  Essential hypertension He was experiencing some lightheadedness which resolved after stopping his blood pressure medicine.  After being off of this for.  He noted that his blood pressure had increased again.  Will restart valsartan /hydrochlorothiazide  80/12.5 mg.    Resting tremor Referral placed to neurology.   Meds ordered this encounter  Medications   ALPRAZolam  (XANAX ) 0.5 MG tablet    Sig: Take 1 tablet (0.5 mg total) by mouth 2 (two) times daily as needed for anxiety.    Dispense:  30 tablet    Refill:  0    This request is for a new prescription for a  controlled substance as required by Federal/State law.   valsartan -hydrochlorothiazide  (DIOVAN -HCT) 80-12.5 MG tablet    Sig: Take 1 tablet by mouth daily.    Dispense:  90 tablet    Refill:  3    No follow-ups on file.

## 2023-08-24 NOTE — Assessment & Plan Note (Signed)
 He was experiencing some lightheadedness which resolved after stopping his blood pressure medicine.  After being off of this for.  He noted that his blood pressure had increased again.  Will restart valsartan /hydrochlorothiazide  80/12.5 mg.

## 2023-08-24 NOTE — Assessment & Plan Note (Signed)
 Referral placed to neurology

## 2023-08-31 ENCOUNTER — Encounter: Payer: Self-pay | Admitting: *Deleted

## 2023-09-01 ENCOUNTER — Ambulatory Visit (HOSPITAL_BASED_OUTPATIENT_CLINIC_OR_DEPARTMENT_OTHER)
Admission: RE | Admit: 2023-09-01 | Discharge: 2023-09-01 | Disposition: A | Discharge status: Home / Self Care | Source: Ambulatory Visit | Attending: Cardiology | Admitting: Cardiology

## 2023-09-01 DIAGNOSIS — R9431 Abnormal electrocardiogram [ECG] [EKG]: Secondary | ICD-10-CM | POA: Insufficient documentation

## 2023-09-01 LAB — ECHOCARDIOGRAM COMPLETE
AR max vel: 2.79 cm2
AV Area VTI: 2.59 cm2
AV Area mean vel: 2.58 cm2
AV Mean grad: 4 mmHg
AV Peak grad: 7.2 mmHg
Ao pk vel: 1.34 m/s
Area-P 1/2: 2.11 cm2
Calc EF: 64 %
S' Lateral: 2.5 cm
Single Plane A2C EF: 62.8 %
Single Plane A4C EF: 65.5 %

## 2023-09-02 ENCOUNTER — Ambulatory Visit: Payer: Self-pay | Admitting: Cardiology

## 2023-09-02 LAB — COMPREHENSIVE METABOLIC PANEL WITH GFR
ALT: 50 IU/L — ABNORMAL HIGH (ref 0–44)
AST: 34 IU/L (ref 0–40)
Albumin: 4.8 g/dL (ref 3.9–4.9)
Alkaline Phosphatase: 72 IU/L (ref 44–121)
BUN/Creatinine Ratio: 20 (ref 10–24)
BUN: 22 mg/dL (ref 8–27)
Bilirubin Total: 0.6 mg/dL (ref 0.0–1.2)
CO2: 22 mmol/L (ref 20–29)
Calcium: 10.1 mg/dL (ref 8.6–10.2)
Chloride: 97 mmol/L (ref 96–106)
Creatinine, Ser: 1.08 mg/dL (ref 0.76–1.27)
Globulin, Total: 2.3 g/dL (ref 1.5–4.5)
Glucose: 100 mg/dL — ABNORMAL HIGH (ref 70–99)
Potassium: 4.1 mmol/L (ref 3.5–5.2)
Sodium: 137 mmol/L (ref 134–144)
Total Protein: 7.1 g/dL (ref 6.0–8.5)
eGFR: 74 mL/min/1.73 (ref >59)

## 2023-09-02 LAB — LIPID PANEL
Chol/HDL Ratio: 3.1 ratio (ref 0.0–5.0)
Cholesterol, Total: 120 mg/dL (ref 100–199)
HDL: 39 mg/dL — ABNORMAL LOW (ref >39)
LDL Chol Calc (NIH): 62 mg/dL (ref 0–99)
Triglycerides: 100 mg/dL (ref 0–149)
VLDL Cholesterol Cal: 19 mg/dL (ref 5–40)

## 2023-09-14 ENCOUNTER — Other Ambulatory Visit: Payer: Self-pay | Admitting: Sports Medicine

## 2023-09-14 DIAGNOSIS — M79642 Pain in left hand: Secondary | ICD-10-CM

## 2023-09-15 ENCOUNTER — Encounter

## 2023-09-15 NOTE — Progress Notes (Signed)
 This encounter was created in error - please disregard.

## 2023-09-21 ENCOUNTER — Ambulatory Visit

## 2023-09-27 ENCOUNTER — Ambulatory Visit

## 2023-09-28 ENCOUNTER — Ambulatory Visit (INDEPENDENT_AMBULATORY_CARE_PROVIDER_SITE_OTHER)

## 2023-09-28 VITALS — Ht 72.0 in | Wt 200.0 lb

## 2023-09-28 DIAGNOSIS — Z0000 Encounter for general adult medical examination without abnormal findings: Secondary | ICD-10-CM | POA: Diagnosis not present

## 2023-09-28 NOTE — Progress Notes (Signed)
 Subjective:   Steven Hebert is a 68 y.o. male who presents for Medicare Annual/Subsequent preventive examination.  Visit Complete: Virtual I connected with  [name] on 09/28/23 by a audio enabled telemedicine application and verified that I am speaking with the correct person using two identifiers.  Patient Location: Home  Provider Location: Office/Clinic  I discussed the limitations of evaluation and management by telemedicine. The patient expressed understanding and agreed to proceed.  Vital Signs: Because this visit was a virtual/telehealth visit, some criteria may be missing or patient reported. Any vitals not documented were not able to be obtained and vitals that have been documented are patient reported.  Patient Medicare AWV questionnaire was completed by the patient on n/a; I have confirmed that all information answered by patient is correct and no changes since this date.  Cardiac Risk Factors include: advanced age (>70men, >51 women);male gender;smoking/ tobacco exposure;dyslipidemia;hypertension     Objective:    Today's Vitals   09/28/23 0756 09/28/23 0757  Weight: 200 lb (90.7 kg)   Height: 6' (1.829 m)   PainSc:  4    Body mass index is 27.12 kg/m.     09/28/2023    8:14 AM 09/07/2022   10:51 AM 07/30/2021   10:30 AM 09/26/2020   10:19 AM  Advanced Directives  Does Patient Have a Medical Advance Directive? Yes Yes Yes Yes  Type of Estate agent of [place];Living will Living will Healthcare Power of [place];Living will Living will  Does patient want to make changes to medical advance directive? No - Patient declined No - Patient declined No - Patient declined No - Patient declined  Copy of Healthcare Power of Attorney in Chart?   No - copy requested     Current Medications (verified) Outpatient Encounter Medications as of 09/28/2023  Medication Sig   acamprosate (CAMPRAL) 333 MG tablet Take 333 mg by mouth in the morning and at  bedtime.   ALPRAZolam  (XANAX ) 0.5 MG tablet Take 1 tablet (0.5 mg total) by mouth 2 (two) times daily as needed for anxiety.   amitriptyline (ELAVIL) 25 MG tablet Take 25 mg by mouth at bedtime.   aspirin  EC 81 MG tablet Take 1 tablet (81 mg total) by mouth daily. Swallow whole.   diclofenac  (VOLTAREN ) 75 MG EC tablet TAKE 1 TABLET BY MOUTH TWICE A DAY   ezetimibe  (ZETIA ) 10 MG tablet TAKE 1 TABLET BY MOUTH EVERY DAY   gabapentin  (NEURONTIN ) 600 MG tablet Take 600 mg by mouth 4 (four) times daily.   HYDROcodone -acetaminophen  (NORCO/VICODIN) 5-325 MG tablet Take 1 tablet by mouth every 4 (four) hours as needed for moderate pain (back/stenosis).   metFORMIN (GLUCOPHAGE-XR) 500 MG 24 hr tablet Take 500 mg by mouth daily.   omeprazole  (PRILOSEC  OTC) 20 MG tablet Take 1 tablet (20 mg total) by mouth daily.   rosuvastatin  (CRESTOR ) 40 MG tablet TAKE 1 TABLET BY MOUTH EVERY DAY   Semaglutide -Weight Management (WEGOVY ) 0.5 MG/0.5ML SOAJ Inject 0.5 mg into the skin once a week. Increase to 1mg  after 4 weeks   traZODone  (DESYREL ) 100 MG tablet TAKE 1 TABLET BY MOUTH EVERYDAY AT BEDTIME   valsartan -hydrochlorothiazide  (DIOVAN -HCT) 80-12.5 MG tablet Take 1 tablet by mouth daily.   Semaglutide -Weight Management (WEGOVY ) 0.25 MG/0.5ML SOAJ Inject 0.25 mg into the skin once a week. Increase to 0.5mg  after 4 weeks. (Patient not taking: Reported on 09/28/2023)   Semaglutide -Weight Management (WEGOVY ) 1 MG/0.5ML SOAJ Inject 1 mg into the skin once a  week. (Patient not taking: Reported on 09/28/2023)   thiamine  (VITAMIN B1) 100 MG tablet Take 1 tablet (100 mg total) by mouth daily. (Patient not taking: Reported on 09/28/2023)   valsartan -hydrochlorothiazide  (DIOVAN -HCT) 320-25 MG tablet Take 0.5 tablets by mouth daily. (Patient not taking: Reported on 09/28/2023)   No facility-administered encounter medications on file as of 09/28/2023.    Allergies (verified) Nsaids and Penicillins   History: Past Medical  History:  Diagnosis Date   Alcohol abuse    Coronary artery disease    GERD (gastroesophageal reflux disease)    H/O degenerative disc disease    High blood pressure    High cholesterol    PUD (peptic ulcer disease)    Past Surgical History:  Procedure Laterality Date   ACHILLES TENDON REPAIR     APPENDECTOMY     HERNIA REPAIR     NOSE SURGERY     Family History  Problem Relation Age of Onset   Hypertension Father    Social History   Socioeconomic History   Marital status: Married    Spouse name: Sonny   Number of children: 2   Years of education: 14   Highest education level: Some college, no degree  Occupational History   Occupation: Pharmacist, 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  Tobacco Use   Smoking status: Former    Current packs/day: 0.00    Average packs/day: 0.5 packs/day for 10.0 years (5.0 ttl pk-yrs)    Types: Cigarettes    Start date: 86    Quit date: 1977    Years since quitting: 48.6   Smokeless tobacco: Never  Vaping Use   Vaping status: Never Used  Substance and Sexual Activity   Alcohol use: Yes    Alcohol/week: 12.0 standard drinks of alcohol    Types: 12 Cans of beer per week    Comment: Quit 07/29/20 (had wine 09/20/20)   Drug use: Not Currently    Types: Marijuana   Sexual activity: Yes    Partners: Female, Male  Other Topics Concern   Not on file  Social History Narrative   Lives with his wife. He has two children. He enjoys woodworking.   Social Drivers of Corporate investment banker Strain: Low Risk  (08/24/2023)   Overall Financial Resource Strain (CARDIA)    Difficulty of Paying Living Expenses: Not hard at all  Food Insecurity: No Food Insecurity (09/28/2023)   Hunger Vital Sign    Worried About Running Out of Food in the Last Year: Never true    Ran Out of Food in the Last Year: Never true  Transportation Needs: No Transportation Needs (09/28/2023)   PRAPARE - Scientist, research (physical sciences) (Medical): No    Lack of Transportation (Non-Medical): No  Physical Activity: Sufficiently Active (09/28/2023)   Exercise Vital Sign    Days of Exercise per Week: 5 days    Minutes of Exercise per Session: 60 min  Stress: No Stress Concern Present (09/28/2023)   [employer] of Occupational Health - Occupational Stress Questionnaire    Feeling of Stress: Not at all  Social Connections: Moderately Isolated (09/28/2023)   Social Connection and Isolation Panel    Frequency of Communication with Friends and Family: More than three times a week    Frequency of Social Gatherings with Friends and Family: Once a week    Attends Religious Services: Never    Database administrator or Organizations:  No    Attends Banker Meetings: Never    Marital Status: Married    Tobacco Counseling Counseling given: Not Answered   Clinical Intake:  Pre-visit preparation completed: Yes  Pain : 0-10 Pain Score: 4  Pain Type: Acute pain Pain Location: Arm Pain Onset: In the past 7 days Pain Frequency: Intermittent     BMI - recorded: 27.12 Nutritional Status: BMI 25 -29 Overweight Nutritional Risks: None Diabetes: No  How often do you need to have someone help you when you read instructions, pamphlets, or other written materials from your doctor or pharmacy?: 1 - Never What is the last grade level you completed in school?: 14  Interpreter Needed?: No      Activities of Daily Living    09/28/2023    8:01 AM  In your present state of health, do you have any difficulty performing the following activities:  Hearing? 1  Comment hearing aids  Vision? 0  Difficulty concentrating or making decisions? 0  Walking or climbing stairs? 0  Dressing or bathing? 0  Doing errands, shopping? 0  Preparing Food and eating ? N  In the past six months, have you accidently leaked urine? N  Do you have problems with loss of bowel control? N  Managing your Medications? N   Managing your Finances? N  Housekeeping or managing your Housekeeping? N    Patient Care Team: Alvia Bring, DO as PCP - General (Family Medicine) Pietro Redell RAMAN, MD as Consulting Physician (Cardiology)  Indicate any recent Medical Services you may have received from other than [facility] providers in the past year (date may be approximate).     Assessment:   This is a routine wellness examination for [employer].  Hearing/Vision screen No results found.   Goals Addressed             This Visit's Progress    Patient Stated       Patient states he would like to continue to lose weight.        Depression Screen    09/28/2023    8:13 AM 02/24/2023    1:22 PM 11/11/2022   10:14 AM 09/07/2022   10:47 AM 03/03/2022   10:29 AM 03/03/2021   10:40 AM 09/26/2020   10:18 AM  PHQ 2/9 Scores  PHQ - 2 Score 0 0 0 0 0 0 0    Fall Risk    09/28/2023    8:14 AM 02/24/2023    1:22 PM 11/11/2022   10:14 AM 09/07/2022   10:47 AM 03/03/2022   10:29 AM  Fall Risk   Falls in the past year? 1 1 0 0 0  Number falls in past yr: 0 0 0 0 0  Injury with Fall? 1 1 0 0 0  Risk for fall due to : History of fall(s) No Fall Risks No Fall Risks No Fall Risks No Fall Risks  Follow up Falls evaluation completed Falls evaluation completed Falls evaluation completed Falls evaluation completed Falls evaluation completed    MEDICARE RISK AT HOME: Medicare Risk at Home Any stairs in or around the home?: Yes If so, are there any without handrails?: Yes Home free of loose throw rugs in walkways, pet beds, electrical cords, etc?: Yes Adequate lighting in your home to reduce risk of falls?: Yes Life alert?: No Use of a cane, walker or w/c?: No Grab bars in the bathroom?: Yes Shower chair or bench in shower?: No Elevated toilet seat or a handicapped  toilet?: Yes  TIMED UP AND GO:  Was the test performed?  No    Cognitive Function:        09/28/2023    8:16 AM 09/07/2022   10:51 AM 09/26/2020   10:23 AM   6CIT Screen  What Year? 0 points 0 points 0 points  What month? 0 points 0 points 0 points  What time? 0 points 0 points 0 points  Count back from 20 0 points 0 points 0 points  Months in reverse 0 points 0 points 0 points  Repeat phrase 0 points 0 points 0 points  Total Score 0 points 0 points 0 points    Immunizations Immunization History  Administered Date(s) Administered   Fluad Quad(high Dose 65+) 11/14/2020   INFLUENZA, HIGH DOSE SEASONAL PF 10/12/2018   Influenza Split 12/17/2009, 01/05/2011, 11/11/2011, 10/25/2012, 11/03/2016, 11/01/2017, 11/24/2017   Influenza, Seasonal, Injecte, Preservative Fre 12/25/2013, 11/19/2014, 10/29/2015, 11/09/2017   Influenza,inj,Quad PF,6+ Mos 11/03/2016, 11/24/2017, 10/14/2022   Influenza-Unspecified 01/05/2011, 11/11/2011, 10/25/2012, 11/03/2016, 11/24/2017, 11/14/2019   Moderna SARS-COV2 Booster Vaccination 11/14/2020   Moderna Sars-Covid-2 Vaccination 01/03/2020, 11/14/2020, 12/09/2021, 10/14/2022   Pneumococcal Conjugate-13 11/29/2018   Pneumococcal Polysaccharide-23 04/28/2016   Tdap 04/28/2015, 05/23/2017   Unspecified SARS-COV-2 Vaccination 03/06/2019, 04/03/2019   Zoster Recombinant(Shingrix ) 11/24/2017, 11/29/2018    TDAP status: Up to date  Flu Vaccine status: Due, Education has been provided regarding the importance of this vaccine. Advised may receive this vaccine at local pharmacy or Health Dept. Aware to provide a copy of the vaccination record if obtained from local pharmacy or Health Dept. Verbalized acceptance and understanding.  Pneumococcal vaccine status: Up to date  Covid-19 vaccine status: Information provided on how to obtain vaccines.   Qualifies for Shingles Vaccine? Yes   Zostavax completed No   Shingrix  Completed?: No.    Education has been provided regarding the importance of this vaccine. Patient has been advised to call insurance company to determine out of pocket expense if they have not yet received this  vaccine. Advised may also receive vaccine at local pharmacy or Health Dept. Verbalized acceptance and understanding.  Screening Tests Health Maintenance  Topic Date Due   INFLUENZA VACCINE  09/02/2023   Pneumococcal Vaccine: 50+ Years (3 of 3 - PCV20 or PCV21) 11/29/2023   COVID-19 Vaccine (6 - 2024-25 season) 11/10/2023 (Originally 12/09/2022)   Medicare Annual Wellness (AWV)  09/27/2024   DTaP/Tdap/Td (3 - Td or Tdap) 05/24/2027   Colonoscopy  12/28/2032   Hepatitis C Screening  Completed   Zoster Vaccines- Shingrix   Completed   HPV VACCINES  Aged Out   Meningococcal B Vaccine  Aged Out    Health Maintenance  Health Maintenance Due  Topic Date Due   INFLUENZA VACCINE  09/02/2023   Pneumococcal Vaccine: 50+ Years (3 of 3 - PCV20 or PCV21) 11/29/2023    Colorectal cancer screening: Type of screening: Colonoscopy. Completed 12/29/2022. Repeat every 10 years  Lung Cancer Screening: (Low Dose CT Chest recommended if Age 59-80 years, 20 pack-year currently smoking OR have quit w/in 15years.) does not qualify.   Lung Cancer Screening Referral: n/a  Additional Screening:  Hepatitis C Screening: does qualify; Completed 03/14/2020  Vision Screening: Recommended annual ophthalmology exams for early detection of glaucoma and other disorders of the eye. Is the patient up to date with their annual eye exam?  Yes  Who is the provider or what is the name of the office in which the patient attends annual eye exams? [facility] If pt  is not established with a provider, would they like to be referred to a provider to establish care? N/a.   Dental Screening: Recommended annual dental exams for proper oral hygiene   Community Resource Referral / Chronic Care Management: CRR required this visit?  No   CCM required this visit?  No     Plan:     I have personally reviewed and noted the following in the patient's chart:   Medical and social history Use of alcohol, tobacco or  illicit drugs  Current medications and supplements including opioid prescriptions. Patient is currently taking opioid prescriptions. Information provided to patient regarding non-opioid alternatives. Patient advised to discuss non-opioid treatment plan with their provider. Functional ability and status Nutritional status Physical activity Advanced directives List of other physicians Hospitalizations # 0, surgeries # 0, and ER # 1 visits in previous 12 months Vitals Screenings to include cognitive, depression, and falls Referrals and appointments  In addition, I have reviewed and discussed with patient certain preventive protocols, quality metrics, and best practice recommendations. A written personalized care plan for preventive services as well as general preventive health recommendations were provided to patient.     Bonny Jon Mayor, CMA   09/28/2023   After Visit Summary: (MyChart) Due to this being a telephonic visit, the after visit summary with patients personalized plan was offered to patient via MyChart   Nurse Notes:   TAYRON HUNNELL is a 68 y.o. male patient of Alvia Bring, DO who had a Medicare Annual Wellness Visit today via telephone. Crew is Retired and lives with their spouse. He has 2 children. He reports that he is socially active and does interact with friends/family regularly. He is moderately physically active and enjoys woodworking.

## 2023-09-28 NOTE — Patient Instructions (Signed)
  Mr. Tat , Thank you for taking time to come for your Medicare Wellness Visit. I appreciate your ongoing commitment to your health goals. Please review the following plan we discussed and let me know if I can assist you in the future.   These are the goals we discussed:  Goals       Patient Stated (pt-stated)      Recently quit alcohol and he would like to make sure that he doesn't drink like before and is only drinking on special occasions.      Patient Stated (pt-stated)      Patient stated that he would like to loose 10-15 lbs.      Patient Stated      Patient states he would like to continue to lose weight.         This is a list of the screening recommended for you and due dates:  Health Maintenance  Topic Date Due   Flu Shot  09/02/2023   Pneumococcal Vaccine for age over 55 (3 of 3 - PCV20 or PCV21) 11/29/2023   COVID-19 Vaccine (6 - 2024-25 season) 11/10/2023*   Medicare Annual Wellness Visit  09/27/2024   DTaP/Tdap/Td vaccine (3 - Td or Tdap) 05/24/2027   Colon Cancer Screening  12/28/2032   Hepatitis C Screening  Completed   Zoster (Shingles) Vaccine  Completed   HPV Vaccine  Aged Out   Meningitis B Vaccine  Aged Out  *Topic was postponed. The date shown is not the original due date.

## 2023-10-04 ENCOUNTER — Encounter: Payer: Self-pay | Admitting: Sports Medicine

## 2023-11-05 ENCOUNTER — Other Ambulatory Visit: Payer: Self-pay | Admitting: Cardiology

## 2023-11-05 DIAGNOSIS — E7800 Pure hypercholesterolemia, unspecified: Secondary | ICD-10-CM

## 2024-01-05 ENCOUNTER — Other Ambulatory Visit: Payer: Self-pay | Admitting: Family Medicine

## 2024-01-09 ENCOUNTER — Encounter: Payer: Self-pay | Admitting: Family Medicine

## 2024-01-09 ENCOUNTER — Ambulatory Visit: Admitting: Family Medicine

## 2024-01-09 VITALS — BP 115/78 | HR 92 | Ht 72.0 in | Wt 206.0 lb

## 2024-01-09 DIAGNOSIS — M79671 Pain in right foot: Secondary | ICD-10-CM | POA: Insufficient documentation

## 2024-01-09 DIAGNOSIS — G252 Other specified forms of tremor: Secondary | ICD-10-CM

## 2024-01-09 DIAGNOSIS — I2510 Atherosclerotic heart disease of native coronary artery without angina pectoris: Secondary | ICD-10-CM

## 2024-01-09 DIAGNOSIS — I10 Essential (primary) hypertension: Secondary | ICD-10-CM

## 2024-01-09 DIAGNOSIS — M1811 Unilateral primary osteoarthritis of first carpometacarpal joint, right hand: Secondary | ICD-10-CM

## 2024-01-09 DIAGNOSIS — M5416 Radiculopathy, lumbar region: Secondary | ICD-10-CM

## 2024-01-09 DIAGNOSIS — M47816 Spondylosis without myelopathy or radiculopathy, lumbar region: Secondary | ICD-10-CM

## 2024-01-09 DIAGNOSIS — F419 Anxiety disorder, unspecified: Secondary | ICD-10-CM

## 2024-01-09 MED ORDER — HYDROCODONE-ACETAMINOPHEN 5-325 MG PO TABS: 1.0000 | ORAL_TABLET | ORAL | 0 refills | Status: AC | PRN

## 2024-01-09 MED ORDER — PREDNISONE 10 MG (48) PO TBPK: ORAL_TABLET | Freq: Every day | ORAL | 0 refills | Status: AC

## 2024-01-09 NOTE — Assessment & Plan Note (Signed)
 Continued chronic low back pain.  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  Adding prednisone  course.  Norco on rare occasion for severe pain.

## 2024-01-09 NOTE — Assessment & Plan Note (Signed)
 He may continue voltaren  gel.  Referral placed to sports medicine.

## 2024-01-09 NOTE — Assessment & Plan Note (Signed)
 Occasional use of alprazolam .  May continue.  He is aware to not take along with hydrocodone .

## 2024-01-09 NOTE — Assessment & Plan Note (Signed)
 He never hear from neurology and prefer to not go to [facility] anyways.  Updated referral placed to [facility] per his request.

## 2024-01-09 NOTE — Assessment & Plan Note (Signed)
 Continue valsartan at current strength.

## 2024-01-09 NOTE — Progress Notes (Signed)
 Steven Hebert - 68 y.o. male MRN [id number]  Date of birth: [birth year]  Subjective Chief Complaint  Patient presents with   Hip Pain   Foot Pain   Thumb pain    HPI Steven Hebert is a 68 y.o. male here today for follow up.  He reports that he is doing ok. SABRA   He remains on valsartan /hydrochlorothiazide  for management of HTN.  Reduced previously due to weight loss and changes in lifestyle with Wegovy .    Remains on crestor  and zetia  for management of HLD.  He is doing well with these.  He is having increased pain in R hip and foot.  Also continues to have pain in the base of the thumbs.  Had injection with temporary relief.   Chronic back pain with neuropathy.  Remains on gabapentin  and elavil.   He is requesting renewal of norco.  Rare use of this.  Last rx lasted >1 year, #30 tabs.   Trazodone  is effective for insomnia.   ROS:  A comprehensive ROS was completed and negative except as noted per HPI   Allergies  Allergen Reactions   Nsaids Other (See Comments)   Penicillins Rash    Past Medical History:  Diagnosis Date   Alcohol abuse    Coronary artery disease    GERD (gastroesophageal reflux disease)    H/O degenerative disc disease    High blood pressure    High cholesterol    PUD (peptic ulcer disease)     Past Surgical History:  Procedure Laterality Date   ACHILLES TENDON REPAIR     APPENDECTOMY     HERNIA REPAIR     NOSE SURGERY      Social History   Socioeconomic History   Marital status: Married    Spouse name: Sonny   Number of children: 2   Years of education: 14   Highest education level: Some college, no degree  Occupational History   Occupation: Pharmacist, Hospital: [employer]    Comment: Retired  Tobacco Use   Smoking status: Former    Current packs/day: 0.00    Average packs/day: 0.5 packs/day for 10.0 years (5.0 ttl pk-yrs)    Types: Cigarettes    Start date: 32    Quit date: 1977    Years since quitting:  48.9   Smokeless tobacco: Never  Vaping Use   Vaping status: Never Used  Substance and Sexual Activity   Alcohol use: Yes    Alcohol/week: 12.0 standard drinks of alcohol    Types: 12 Cans of beer per week    Comment: Quit 07/29/20 (had wine 09/20/20)   Drug use: Not Currently    Types: Marijuana   Sexual activity: Yes    Partners: Female, Male  Other Topics Concern   Not on file  Social History Narrative   Lives with his wife. He has two children. He enjoys woodworking.   Social Drivers of Corporate Investment Banker Strain: Low Risk  (08/24/2023)   Overall Financial Resource Strain (CARDIA)    Difficulty of Paying Living Expenses: Not hard at all  Food Insecurity: No Food Insecurity (09/28/2023)   Hunger Vital Sign    Worried About Running Out of Food in the Last Year: Never true    Ran Out of Food in the Last Year: Never true  Transportation Needs: No Transportation Needs (09/28/2023)   PRAPARE - Administrator, Civil Service (Medical): No  Lack of Transportation (Non-Medical): No  Physical Activity: Sufficiently Active (09/28/2023)   Exercise Vital Sign    Days of Exercise per Week: 5 days    Minutes of Exercise per Session: 60 min  Stress: No Stress Concern Present (09/28/2023)   [employer] of Occupational Health - Occupational Stress Questionnaire    Feeling of Stress: Not at all  Social Connections: Moderately Isolated (09/28/2023)   Social Connection and Isolation Panel    Frequency of Communication with Friends and Family: More than three times a week    Frequency of Social Gatherings with Friends and Family: Once a week    Attends Religious Services: Never    Database Administrator or Organizations: No    Attends Engineer, Structural: Never    Marital Status: Married    Family History  Problem Relation Age of Onset   Hypertension Father     Health Maintenance  Topic Date Due   COVID-19 Vaccine (6 - 2025-26 season) 10/03/2023    Pneumococcal Vaccine: 50+ Years (3 of 3 - PCV20 or PCV21) 11/29/2023   Influenza Vaccine  05/01/2024 (Originally 09/02/2023)   Medicare Annual Wellness (AWV)  09/27/2024   DTaP/Tdap/Td (3 - Td or Tdap) 05/24/2027   Colonoscopy  12/28/2032   Hepatitis C Screening  Completed   Zoster Vaccines- Shingrix   Completed   Meningococcal B Vaccine  Aged Out     ----------------------------------------------------------------------------------------------------------------------------------------------------------------------------------------------------------------- Physical Exam BP 115/78 (BP Location: Left Arm, Patient Position: Sitting, Cuff Size: Large)   Pulse 92   Ht 6' (1.829 m)   Wt 206 lb (93.4 kg)   SpO2 99%   BMI 27.94 kg/m   Physical Exam Constitutional:      Appearance: Normal appearance.  Eyes:     General: No scleral icterus. Cardiovascular:     Rate and Rhythm: Normal rate and regular rhythm.  Pulmonary:     Effort: Pulmonary effort is normal.     Breath sounds: Normal breath sounds.  Neurological:     General: No focal deficit present.     Mental Status: He is alert.  Psychiatric:        Mood and Affect: Mood normal.        Behavior: Behavior normal.     ------------------------------------------------------------------------------------------------------------------------------------------------------------------------------------------------------------------- Assessment and Plan  Essential hypertension Continue valsartan  at current strength.   Resting tremor He never hear from neurology and prefer to not go to [facility] anyways.  Updated referral placed to [facility] per his request.   Coronary artery disease involving native coronary artery of native heart without angina pectoris Medical management with aggressive lipid control, 81 mg aspirin .  Doing well with Wegovy .  We'll continue at current strength.    Primary osteoarthritis of first carpometacarpal joint of  right hand He may continue voltaren  gel.  Referral placed to sports medicine.   Anxiety Occasional use of alprazolam .  May continue.  He is aware to not take along with hydrocodone .   Lumbar spondylosis Continued chronic low back pain.  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  Adding prednisone  course.  Norco on rare occasion for severe pain.    Meds ordered this encounter  Medications   HYDROcodone -acetaminophen  (NORCO/VICODIN) 5-325 MG tablet    Sig: Take 1 tablet by mouth every 4 (four) hours as needed for moderate pain (pain score 4-6) (back/stenosis).    Dispense:  30 tablet    Refill:  0   predniSONE  (STERAPRED UNI-PAK 48 TAB) 10 MG (48)  TBPK tablet    Sig: Take by mouth daily. 12-day taper pack, use as directed for taper    Dispense:  48 tablet    Refill:  0    Return in about 6 months (around 07/09/2024).

## 2024-01-09 NOTE — Assessment & Plan Note (Signed)
 Medical management with aggressive lipid control, 81 mg aspirin .  Doing well with Wegovy .  We'll continue at current strength.

## 2024-01-12 ENCOUNTER — Ambulatory Visit

## 2024-01-12 ENCOUNTER — Other Ambulatory Visit: Payer: Self-pay

## 2024-01-12 VITALS — BP 128/80 | Ht 72.0 in | Wt 206.0 lb

## 2024-01-12 DIAGNOSIS — G5601 Carpal tunnel syndrome, right upper limb: Secondary | ICD-10-CM | POA: Diagnosis not present

## 2024-01-12 DIAGNOSIS — G8929 Other chronic pain: Secondary | ICD-10-CM | POA: Diagnosis not present

## 2024-01-12 DIAGNOSIS — M19071 Primary osteoarthritis, right ankle and foot: Secondary | ICD-10-CM | POA: Diagnosis not present

## 2024-01-12 DIAGNOSIS — M79671 Pain in right foot: Secondary | ICD-10-CM | POA: Diagnosis not present

## 2024-01-12 DIAGNOSIS — M1811 Unilateral primary osteoarthritis of first carpometacarpal joint, right hand: Secondary | ICD-10-CM

## 2024-01-12 DIAGNOSIS — G5602 Carpal tunnel syndrome, left upper limb: Secondary | ICD-10-CM

## 2024-01-12 DIAGNOSIS — M79644 Pain in right finger(s): Secondary | ICD-10-CM | POA: Diagnosis not present

## 2024-01-12 NOTE — Progress Notes (Signed)
 Subjective:    Patient ID: [name], male    DOB: 68 y.o., [birth year]   MRN: [id number]  Chief Complaint: Bilateral hand numbness/tingling, right thumb pain, right pinky toe pain  History of Present Illness [name] is a 68 year old male with past medical history significant for CAD, hypertension, peripheral neuropathy, meralgia paresthetica on the left, prediabetes, carpal tunnel syndrome bilaterally (previously managed by Dr. Curtis), lumbar radiculopathy (last received ESI at Left L3-4 and S1 by [facility] on 11/25/2022), right CMC arthritis presenting complaining of multiple joint pains.  Endorsing numbness and tingling present in his bilateral wrists/hands This is going on for some time (years) Has had nerve conduction study done on his left wrist which showed severe carpal tunnel He has not had this test done on the contralateral side. No prior treatments apart from wrist cock up splint placement nightly. Still having breakthrough symptoms despite this.  Endorsing pain at the base of his right thumb. He is right-hand dominant States this was injected once by Dr. Curtis with temporary relief Particularly painful when trying to open jars or twist doors or grip half gallon of milk. No known injury  Also endorsing pain in his right pinky toe. Reports this portion of his foot has been painful for a while and flares up occasionally Currently doing slightly better than usual No formal treatments for this including injections, or physical therapy No injury that he can recall No numbness or tingling  Review of pertinent imaging: 03/11/2022 3 view plain film radiographs obtained of the bilateral hands revealing moderate joint space narrowing at the [facility] joint with reactive subchondral sclerosis present and some osteophytic change (right> left).     Objective:   There were no vitals filed for this visit.  Bilateral wrists:  Positive Tinel's.  Positive  Durkan's/Phalen's.  Right hand: Positive CMC grind.  Tenderness to palpation over the [facility] joint.  Negative Finkelstein.  Negative Eickhoff.  Right foot: Tenderness to palpation directly over the MTP joint on the plantar side of the fifth digit. No overlying skin changes No tenderness over the dorsal aspect of the MTP joint of the fifth digit. Passive dorsiflexion this joint does cause pain terminally. Passive plantarflexion does not affect this.  Limited ultrasound evaluation of the bilateral wrists, right hand, and right foot: Degenerative appearing changes including prominent hypoechogenic fluid signal present within the joint of the MTP joint of the fifth digit.  Slightly irregular sesamoid versus intratendinous calcification present in the flexor tendon at the level of the MTP of the fifth digit.  Osteophytic and degenerative appearing changes with hypoechogenic fluid signal present within the joint at the [facility] joint of the right hand. Bilateral median nerves appear slightly enlarged and flattened (left> right) Impression: Fifth MTP joint arthritis + calcific flexor tendinitis vs sesamoiditis Right CMC joint arthritis Likely bilateral carpal tunnel (though I did not measure the cross-sectional area of his median nerves)    Assessment & Plan:   Assessment & Plan [name] is a pleasant 68 year old male presenting with multiple joint pains consistent with bilateral carpal tunnel, right CMC joint arthritis, right fifth MTP joint arthritis (+/- sesamoiditis vs calcific flexor tendinopathy).  I recommend he follow-up to obtain injection of either his right [facility] joint, his left carpal tunnel, his right carpal tunnel, or his right fifth MTP joint.  Potentially would be a good idea to pursue 2 of these at a time but given his excellent blood sugar control might be open to doing 3.  Greater  than 60 minutes were spent in reviewing prior patient records, reviewing his prior pertinent imaging studies, in  face-to-face time interacting with the patient discussing interpretation of his ultrasound findings today, discussing treatment options & recommendations for next steps, and completing documentation for the aforementioned on the same day of service.

## 2024-01-23 ENCOUNTER — Other Ambulatory Visit: Payer: Self-pay

## 2024-01-23 ENCOUNTER — Ambulatory Visit

## 2024-01-23 VITALS — BP 130/80 | Ht 72.0 in | Wt 206.0 lb

## 2024-01-23 DIAGNOSIS — M1811 Unilateral primary osteoarthritis of first carpometacarpal joint, right hand: Secondary | ICD-10-CM

## 2024-01-23 DIAGNOSIS — G5602 Carpal tunnel syndrome, left upper limb: Secondary | ICD-10-CM

## 2024-01-23 DIAGNOSIS — L84 Corns and callosities: Secondary | ICD-10-CM | POA: Diagnosis not present

## 2024-01-23 DIAGNOSIS — M19071 Primary osteoarthritis, right ankle and foot: Secondary | ICD-10-CM

## 2024-01-23 DIAGNOSIS — G5601 Carpal tunnel syndrome, right upper limb: Secondary | ICD-10-CM

## 2024-01-23 MED ORDER — METHYLPREDNISOLONE ACETATE 40 MG/ML IJ SUSP
40.0000 mg | Freq: Once | INTRAMUSCULAR | Status: AC
  Administered 2024-01-23: 20 mg via INTRA_ARTICULAR

## 2024-01-23 NOTE — Progress Notes (Signed)
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   Lab Results  Component Value Date   HGBA1C 5.9 (H) 11/11/2022     Objective:   Vitals:   01/23/24 1318  BP: 130/80   Right foot exam: There is a hyperkeratotic appearing callus with a central core present at the MTP joint of the fifth digit of the right foot.  This area is tender to palpation and corresponds with the patient's pain that he reports at home.  Carpal Tunnel Injection with Ultrasound Guidance Procedure Note  Steven SHAMOON [birth year] Indications: Numbness Procedure Details Following the description of risks including infection bleeding, damage to surrounding structures, patient provided verbal/written consent for left carpal tunnel injection procedure with ultrasound guidance. Patient was sterilely prepped in the usual fashion with chlorhexidine. Following topical anesthetization with ethyl chloride, sterile ultrasound technique was used to identify the median nerve. Patient was then injected with a solution of 20mg  Depo-medrol , 4cc of 2% Mepivicaine, and 0.25cc of 8.4% Sodium Bicarbonate. This was well visualized under ultrasound, please see associated photographic documentation. Patient tolerated well without complication. Precautions provided. Cleaned and dressing applied.  [facility] Joint Injection with Ultrasound Guidance Steven Hebert 1955-12-26 Indications: Pain Procedure Details Following the description of risks including infection bleeding, damage to surrounding structures, patient provided verbal/written consent for right [facility] Joint injection procedure with ultrasound guidance. Ultrasound  was used to identify the [facility] joint and was used to guide this procedure. Patient was sterilely prepped in the usual fashion with chlorhexidine. Sterile ultrasound gel was used for the procedure. Following topical anesthetization with ethyl chloride, the patient was injected into the joint with a solution of 20mg  Depo-Medrol  and 1cc 2% Mepivicaine. This was well visualized under ultrasound, please see associated photographic documentation. Patient tolerated well without complication. Precautions provided. Cleaned and dressing applied.     Assessment & Plan:   Assessment & Plan Bilateral carpal tunnel syndrome (severe on left) Right CMC joint arthritis Right fifth MTP joint osteophytic change Patient tolerated right CMC joint, left carpal tunnel injections well today.  After further examination of the patient's right foot, it was determined that he has a corn over the area corresponding to his pain on the MTP.  I recommend debriding this.  After discussion of this, patient states he would prefer to not have this done today, also our clinic does not currently carry scalpels. I recommend following up with his primary care physician to ensure that this gets done.    "

## 2024-03-02 ENCOUNTER — Other Ambulatory Visit: Payer: Self-pay

## 2024-03-02 DIAGNOSIS — M79642 Pain in left hand: Secondary | ICD-10-CM

## 2024-03-02 MED ORDER — DICLOFENAC SODIUM 75 MG PO TBEC: 75.0000 mg | DELAYED_RELEASE_TABLET | Freq: Two times a day (BID) | ORAL | 3 refills | Status: AC

## 2024-03-02 NOTE — Telephone Encounter (Signed)
 Copied from CRM #[id number]. Topic: Clinical - Medication Refill >> Mar 02, 2024 10:48 AM Joesph NOVAK wrote: Medication: diclofenac  (VOLTAREN ) 75 MG EC table  Has the patient contacted their pharmacy? Yes (Agent: If no, request that the patient contact the pharmacy for the refill. If patient does not wish to contact the pharmacy document the reason why and proceed with request.) (Agent: If yes, when and what did the pharmacy advise?)  This is the patient's preferred pharmacy:  [id number] - [place], [place] - [address] [place] [address] Phone: [phone] Fax: [phone]  Is this the correct pharmacy for this prescription? Yes If no, delete pharmacy and type the correct one.   Has the prescription been filled recently? Yes  Is the patient out of the medication? Yes  Has the patient been seen for an appointment in the last year OR does the patient have an upcoming appointment? Yes  Can we respond through MyChart? Yes  Agent: Please be advised that Rx refills may take up to 3 business days. We ask that you follow-up with your pharmacy.

## 2024-03-02 NOTE — Telephone Encounter (Signed)
 Pended prescription.  Last filled by Dr Curtis

## 2024-03-08 ENCOUNTER — Other Ambulatory Visit: Payer: Self-pay | Admitting: Cardiology

## 2024-03-08 DIAGNOSIS — I2510 Atherosclerotic heart disease of native coronary artery without angina pectoris: Secondary | ICD-10-CM

## 2024-03-08 DIAGNOSIS — E7800 Pure hypercholesterolemia, unspecified: Secondary | ICD-10-CM

## 2024-03-12 ENCOUNTER — Ambulatory Visit

## 2024-07-09 ENCOUNTER — Ambulatory Visit: Admitting: Family Medicine

## 2024-10-02 ENCOUNTER — Ambulatory Visit
